# Patient Record
Sex: Female | Born: 1975 | Race: White | Hispanic: No | Smoking: Current every day smoker
Health system: Southern US, Community
[De-identification: ages and names within clinical notes are randomized; demographics above are authoritative.]

## PROBLEM LIST (undated history)

## (undated) DIAGNOSIS — M1990 Unspecified osteoarthritis, unspecified site: Secondary | ICD-10-CM

## (undated) DIAGNOSIS — Z8489 Family history of other specified conditions: Secondary | ICD-10-CM

## (undated) DIAGNOSIS — I10 Essential (primary) hypertension: Secondary | ICD-10-CM

## (undated) DIAGNOSIS — K219 Gastro-esophageal reflux disease without esophagitis: Secondary | ICD-10-CM

## (undated) DIAGNOSIS — F419 Anxiety disorder, unspecified: Secondary | ICD-10-CM

## (undated) DIAGNOSIS — E119 Type 2 diabetes mellitus without complications: Secondary | ICD-10-CM

## (undated) HISTORY — PX: OTHER SURGICAL HISTORY: SHX169

## (undated) HISTORY — PX: DILATION AND CURETTAGE OF UTERUS: SHX78

---

## 2016-06-26 NOTE — Patient Instructions (Addendum)
Katherine Shepherd  06/26/2016   Your procedure is scheduled on: 07/08/2016    [facility]  Entrance [place]  elevators to 3rd floor to  Short Stay Center at    0830 AM.  Call this number if you have problems the morning of surgery [phone]   Remember: ONLY 1 PERSON MAY GO WITH YOU TO SHORT STAY TO GET  READY MORNING OF YOUR SURGERY.  Do not eat food or drink liquids :After Midnight.     Take these medicines the morning of surgery with A SIP OF WATER: oxycodone if needed, Nexium  DO NOT TAKE ANY DIABETIC MEDICATIONS DAY OF YOUR SURGERY                               You may not have any metal on your body including hair pins and              piercings  Do not wear jewelry, make-up, lotions, powders or perfumes, deodorant             Do not wear nail polish.  Do not shave  48 hours prior to surgery.               Do not bring valuables to the hospital. [place] IS NOT             RESPONSIBLE   FOR VALUABLES.  Contacts, dentures or bridgework may not be worn into surgery.  Leave suitcase in the car. After surgery it may be brought to your room.        Special Instructions: coughing and deep breathing exercises, leg exercises               Please read over the following fact sheets you were given: _____________________________________________________________________             [facility] Pre[facility] Before surgery, you can play an important role.  Because skin is not sterile, your skin needs to be as free of germs as possible.  You can reduce the number of germs on your skin by washing with CHG (chlorahexidine gluconate) soap before surgery.  CHG is an antiseptic cleaner which kills germs and bonds with the skin to continue killing germs even after washing. Please DO NOT use if you have an allergy to CHG or antibacterial soaps.  If your skin becomes reddened/irritated stop using the CHG and inform your nurse when you arrive  at Short Stay. Do not shave (including legs and underarms) for at least 48 hours prior to the first CHG shower.  You may shave your face/neck. Please follow these instructions carefully:  1.  Shower with CHG Soap the night before surgery and the  morning of Surgery.  2.  If you choose to wash your hair, wash your hair first as usual with your  normal  shampoo.  3.  After you shampoo, rinse your hair and body thoroughly to remove the  shampoo.                           4.  Use CHG as you would any other liquid soap.  You can apply chg directly  to the skin and wash  Gently with a scrungie or clean washcloth.  5.  Apply the CHG Soap to your body ONLY FROM THE NECK DOWN.   Do not use on face/ open                           Wound or open sores. Avoid contact with eyes, ears mouth and genitals (private parts).                       Wash face,  Genitals (private parts) with your normal soap.             6.  Wash thoroughly, paying special attention to the area where your surgery  will be performed.  7.  Thoroughly rinse your body with warm water from the neck down.  8.  DO NOT shower/wash with your normal soap after using and rinsing off  the CHG Soap.                9.  Pat yourself dry with a clean towel.            10.  Wear clean pajamas.            11.  Place clean sheets on your bed the night of your first shower and do not  sleep with pets. Day of Surgery : Do not apply any lotions/deodorants the morning of surgery.  Please wear clean clothes to the hospital/surgery center.  FAILURE TO FOLLOW THESE INSTRUCTIONS MAY RESULT IN THE CANCELLATION OF YOUR SURGERY PATIENT SIGNATURE_________________________________  NURSE SIGNATURE__________________________________  ________________________________________________________________________  WHAT IS A BLOOD TRANSFUSION? Blood Transfusion Information  A transfusion is the replacement of blood or some of its parts. Blood is made  up of multiple cells which provide different functions.  Red blood cells carry oxygen and are used for blood loss replacement.  White blood cells fight against infection.  Platelets control bleeding.  Plasma helps clot blood.  Other blood products are available for specialized needs, such as hemophilia or other clotting disorders. BEFORE THE TRANSFUSION  Who gives blood for transfusions?   Healthy volunteers who are fully evaluated to make sure their blood is safe. This is blood bank blood. Transfusion therapy is the safest it has ever been in the practice of medicine. Before blood is taken from a donor, a complete history is taken to make sure that person has no history of diseases nor engages in risky social behavior (examples are intravenous drug use or sexual activity with multiple partners). The donor's travel history is screened to minimize risk of transmitting infections, such as malaria. The donated blood is tested for signs of infectious diseases, such as HIV and hepatitis. The blood is then tested to be sure it is compatible with you in order to minimize the chance of a transfusion reaction. If you or a relative donates blood, this is often done in anticipation of surgery and is not appropriate for emergency situations. It takes many days to process the donated blood. RISKS AND COMPLICATIONS Although transfusion therapy is very safe and saves many lives, the main dangers of transfusion include:  1. Getting an infectious disease. 2. Developing a transfusion reaction. This is an allergic reaction to something in the blood you were given. Every precaution is taken to prevent this. The decision to have a blood transfusion has been considered carefully by your caregiver before blood is given. Blood is not given unless the benefits outweigh  the risks. AFTER THE TRANSFUSION  Right after receiving a blood transfusion, you will usually feel much better and more energetic. This is especially  true if your red blood cells have gotten low (anemic). The transfusion raises the level of the red blood cells which carry oxygen, and this usually causes an energy increase.  The nurse administering the transfusion will monitor you carefully for complications. HOME CARE INSTRUCTIONS  No special instructions are needed after a transfusion. You may find your energy is better. Speak with your caregiver about any limitations on activity for underlying diseases you may have. SEEK MEDICAL CARE IF:   Your condition is not improving after your transfusion.  You develop redness or irritation at the intravenous (IV) site. SEEK IMMEDIATE MEDICAL CARE IF:  Any of the following symptoms occur over the next 12 hours:  Shaking chills.  You have a temperature by mouth above 102 F (38.9 C), not controlled by medicine.  Chest, back, or muscle pain.  People around you feel you are not acting correctly or are confused.  Shortness of breath or difficulty breathing.  Dizziness and fainting.  You get a rash or develop hives.  You have a decrease in urine output.  Your urine turns a dark color or changes to pink, red, or brown. Any of the following symptoms occur over the next 10 days:  You have a temperature by mouth above 102 F (38.9 C), not controlled by medicine.  Shortness of breath.  Weakness after normal activity.  The white part of the eye turns yellow (jaundice).  You have a decrease in the amount of urine or are urinating less often.  Your urine turns a dark color or changes to pink, red, or brown. Document Released: 12/13/2000 Document Revised: 03/09/2012 Document Reviewed: 08/01/2008 ExitCare Patient Information 2014 [place].  _______________________________________________________________________  Incentive Spirometer  An incentive spirometer is a tool that can help keep your lungs clear and active. This tool measures how well you are filling your lungs with each  breath. Taking long deep breaths may help reverse or decrease the chance of developing breathing (pulmonary) problems (especially infection) following:  A long period of time when you are unable to move or be active. BEFORE THE PROCEDURE   If the spirometer includes an indicator to show your best effort, your nurse or respiratory therapist will set it to a desired goal.  If possible, sit up straight or lean slightly forward. Try not to slouch.  Hold the incentive spirometer in an upright position. INSTRUCTIONS FOR USE  3. Sit on the edge of your bed if possible, or sit up as far as you can in bed or on a chair. 4. Hold the incentive spirometer in an upright position. 5. Breathe out normally. 6. Place the mouthpiece in your mouth and seal your lips tightly around it. 7. Breathe in slowly and as deeply as possible, raising the piston or the ball toward the top of the column. 8. Hold your breath for 3-5 seconds or for as long as possible. Allow the piston or ball to fall to the bottom of the column. 9. Remove the mouthpiece from your mouth and breathe out normally. 10. Rest for a few seconds and repeat Steps 1 through 7 at least 10 times every 1-2 hours when you are awake. Take your time and take a few normal breaths between deep breaths. 11. The spirometer may include an indicator to show your best effort. Use the indicator as a goal to work  toward during each repetition. 12. After each set of 10 deep breaths, practice coughing to be sure your lungs are clear. If you have an incision (the cut made at the time of surgery), support your incision when coughing by placing a pillow or rolled up towels firmly against it. Once you are able to get out of bed, walk around indoors and cough well. You may stop using the incentive spirometer when instructed by your caregiver.  RISKS AND COMPLICATIONS  Take your time so you do not get dizzy or light-headed.  If you are in pain, you may need to take or ask  for pain medication before doing incentive spirometry. It is harder to take a deep breath if you are having pain. AFTER USE  Rest and breathe slowly and easily.  It can be helpful to keep track of a log of your progress. Your caregiver can provide you with a simple table to help with this. If you are using the spirometer at home, follow these instructions: [place] IF:   You are having difficultly using the spirometer.  You have trouble using the spirometer as often as instructed.  Your pain medication is not giving enough relief while using the spirometer.  You develop fever of 100.5 F (38.1 C) or higher. SEEK IMMEDIATE MEDICAL CARE IF:   You cough up bloody sputum that had not been present before.  You develop fever of 102 F (38.9 C) or greater.  You develop worsening pain at or near the incision site. MAKE SURE YOU:   Understand these instructions.  Will watch your condition.  Will get help right away if you are not doing well or get worse. Document Released: 04/28/2007 Document Revised: 03/09/2012 Document Reviewed: 06/29/2007 [facility] Patient Information 2014 [place], [place].   ________________________________________________________________________

## 2016-06-27 ENCOUNTER — Encounter (HOSPITAL_COMMUNITY)
Admission: RE | Admit: 2016-06-27 | Discharge: 2016-06-27 | Disposition: A | Discharge status: Home / Self Care | Payer: BLUE CROSS/BLUE SHIELD | Source: Ambulatory Visit | Attending: Orthopedic Surgery | Admitting: Orthopedic Surgery

## 2016-06-27 ENCOUNTER — Encounter (HOSPITAL_COMMUNITY): Payer: Self-pay

## 2016-06-27 DIAGNOSIS — Z01812 Encounter for preprocedural laboratory examination: Secondary | ICD-10-CM | POA: Insufficient documentation

## 2016-06-27 DIAGNOSIS — Z0183 Encounter for blood typing: Secondary | ICD-10-CM | POA: Diagnosis not present

## 2016-06-27 DIAGNOSIS — M160 Bilateral primary osteoarthritis of hip: Secondary | ICD-10-CM | POA: Diagnosis not present

## 2016-06-27 HISTORY — DX: Type 2 diabetes mellitus without complications: E11.9

## 2016-06-27 HISTORY — DX: Family history of other specified conditions: Z84.89

## 2016-06-27 HISTORY — DX: Gastro-esophageal reflux disease without esophagitis: K21.9

## 2016-06-27 HISTORY — DX: Unspecified osteoarthritis, unspecified site: M19.90

## 2016-06-27 HISTORY — DX: Anxiety disorder, unspecified: F41.9

## 2016-06-27 HISTORY — DX: Essential (primary) hypertension: I10

## 2016-06-27 LAB — TYPE AND SCREEN
ABO/RH(D): A POS
ANTIBODY SCREEN: NEGATIVE

## 2016-06-27 LAB — BASIC METABOLIC PANEL
ANION GAP: 8 (ref 5–15)
BUN: 10 mg/dL (ref 6–20)
CALCIUM: 8.7 mg/dL — AB (ref 8.9–10.3)
CHLORIDE: 100 mmol/L — AB (ref 101–111)
CO2: 27 mmol/L (ref 22–32)
CREATININE: 0.6 mg/dL (ref 0.44–1.00)
GFR calc non Af Amer: >60 mL/min (ref >60)
Glucose, Bld: 89 mg/dL (ref 65–99)
POTASSIUM: 3.3 mmol/L — AB (ref 3.5–5.1)
Sodium: 135 mmol/L (ref 135–145)

## 2016-06-27 LAB — HCG, SERUM, QUALITATIVE: Preg, Serum: NEGATIVE

## 2016-06-27 LAB — CBC
HEMATOCRIT: 37.9 % (ref 36.0–46.0)
Hemoglobin: 12.7 g/dL (ref 12.0–15.0)
MCH: 28.2 pg (ref 26.0–34.0)
MCHC: 33.5 g/dL (ref 30.0–36.0)
MCV: 84 fL (ref 78.0–100.0)
Platelets: 352 10*3/uL (ref 150–400)
RBC: 4.51 MIL/uL (ref 3.87–5.11)
RDW: 14.8 % (ref 11.5–15.5)
WBC: 9 10*3/uL (ref 4.0–10.5)

## 2016-06-27 LAB — ABO/RH: ABO/RH(D): A POS

## 2016-06-27 LAB — SURGICAL PCR SCREEN
MRSA, PCR: NEGATIVE
STAPHYLOCOCCUS AUREUS: POSITIVE — AB

## 2016-06-27 NOTE — H&P (Signed)
TOTAL HIP ADMISSION H&P  Patient is admitted for bilateral total hip arthroplasty, anterior approach.  Subjective:  Chief Complaint:    Bilateral hip primary OA / pain  HPI: Katherine Shepherd, 40 y.o. female, has a history of pain and functional disability in the bilateral hip(s) due to arthritis and patient has failed non-surgical conservative treatments for greater than 12 weeks to include NSAID's and/or analgesics, corticosteriod injections and activity modification.  Onset of symptoms was gradual starting >10 years ago with rapidlly worsening course since that time.The patient noted no past surgery on the bilateral hip(s).  Patient currently rates pain in the bilateral hips at 10 out of 10 with activity. Patient has night pain, worsening of pain with activity and weight bearing, trendelenberg gait, pain that interfers with activities of daily living and pain with passive range of motion. Patient has evidence of periarticular osteophytes and joint space narrowing by imaging studies. This condition presents safety issues increasing the risk of falls.  There is no current active infection.   Risks, benefits and expectations were discussed with the patient.  Risks including but not limited to the risk of anesthesia, blood clots, nerve damage, blood vessel damage, failure of the prosthesis, infection and up to and including death.  Patient understand the risks, benefits and expectations and wishes to proceed with surgery.   PCP: Pcp Not In System  D/C Plans:      Home with HHPT  Post-op Meds:       No Rx given  Tranexamic Acid:      To be given - IV   Decadron:      Is to be given  FYI:     Xarelto (unable to tolerate ASA do to GERD)  Oxycodone 10-20 mg (already on Percocet 7.5/325 q 6 hrs prn)  Nicotine patch  14 mg   Past Medical History  Diagnosis Date  . Family history of adverse reaction to anesthesia     nauea, mother- had episode of not being able to breathe - mother small airway    . Hypertension   . Diabetes mellitus without complication (HCC)     hx of gestational diabetes   . Anxiety   . GERD (gastroesophageal reflux disease)   . Arthritis     Past Surgical History  Procedure Laterality Date  . Cesarean section    . Dilation and curettage of uterus    . Cyst removal lower back       No prescriptions prior to admission   Allergies  Allergen Reactions  . Sulfa Antibiotics Hives  . Tramadol Nausea And Vomiting    Social History  Substance Use Topics  . Smoking status: Current Every Day Smoker -- 0.50 packs/day for 9 years    Types: Cigarettes  . Smokeless tobacco: Never Used  . Alcohol Use: No       Review of Systems  Constitutional: Negative.   HENT: Negative.   Eyes: Negative.   Respiratory: Negative.   Cardiovascular: Negative.   Gastrointestinal: Positive for heartburn.  Genitourinary: Negative.   Musculoskeletal: Positive for joint pain.  Skin: Negative.   Neurological: Negative.   Endo/Heme/Allergies: Negative.   Psychiatric/Behavioral: The patient is nervous/anxious.     Objective:  Physical Exam  Constitutional: She is oriented to person, place, and time. She appears well-developed.  HENT:  Head: Normocephalic.  Eyes: Pupils are equal, round, and reactive to light.  Neck: Neck supple. No JVD present. No tracheal deviation present. No thyromegaly present.  Cardiovascular:  Normal rate, regular rhythm, normal heart sounds and intact distal pulses.   Respiratory: Effort normal and breath sounds normal. No stridor. No respiratory distress. She has no wheezes.  GI: Soft. There is no tenderness. There is no guarding.  Musculoskeletal:       Right hip: She exhibits decreased range of motion, decreased strength, tenderness and bony tenderness. She exhibits no swelling, no deformity and no laceration.       Left hip: She exhibits decreased range of motion, decreased strength and tenderness. She exhibits no swelling, no deformity and no  laceration.  Lymphadenopathy:    She has no cervical adenopathy.  Neurological: She is alert and oriented to person, place, and time. A sensory deficit (tingling in right foot ) is present.  Skin: Skin is warm and dry.  Psychiatric: She has a normal mood and affect.    Vital signs in last 24 hours: Temp:  [98.7 F (37.1 C)] 98.7 F (37.1 C) (06/29 1552) Pulse Rate:  [93] 93 (06/29 1552) Resp:  [16] 16 (06/29 1552) BP: (130)/(100) 130/100 mmHg (06/29 1552) SpO2:  [100 %] 100 % (06/29 1552) Weight:  [56.246 kg (124 lb)] 56.246 kg (124 lb) (06/29 1552)    Imaging Review Plain radiographs demonstrate severe degenerative joint disease of the bilateral hip(s). The bone quality appears to be good for age and reported activity level.  Assessment/Plan:  End stage arthritis, bilateral hips  The patient history, physical examination, clinical judgement of the provider and imaging studies are consistent with end stage degenerative joint disease of the bilateral hips and total hip arthroplasties are deemed medically necessary. The treatment options including medical management, injection therapy, arthroscopy and arthroplasty were discussed at length. The risks and benefits of total hip arthroplasty were presented and reviewed. The risks due to aseptic loosening, infection, stiffness, dislocation/subluxation,  thromboembolic complications and other imponderables were discussed.  The patient acknowledged the explanation, agreed to proceed with the plan and consent was signed. Patient is being admitted for inpatient treatment for surgery, pain control, PT, OT, prophylactic antibiotics, VTE prophylaxis, progressive ambulation and ADL's and discharge planning.The patient is planning to be discharged home with home health services.     Anastasio AuerbachMatthew S. Rexton Greulich   PA-C  06/27/2016, 5:22 PM

## 2016-06-27 NOTE — Progress Notes (Signed)
BMp done 06/27/16 faxed via EPIC to Dr Charlann Boxerlin.

## 2016-06-27 NOTE — Progress Notes (Signed)
EKG done 06/24/2016 on chart.

## 2016-06-28 NOTE — Progress Notes (Signed)
Spoke with pt on the phone. She understands her surgery time has changed and she is to arrive at the hospital at 11:40AM.  She also will follow instructions to pick up Mupirocin and use as directed.

## 2016-07-08 ENCOUNTER — Encounter (HOSPITAL_COMMUNITY)
Admission: RE | Disposition: A | Discharge status: Home Health | Payer: Self-pay | Source: Ambulatory Visit | Attending: Orthopedic Surgery

## 2016-07-08 ENCOUNTER — Inpatient Hospital Stay (HOSPITAL_COMMUNITY): Payer: BLUE CROSS/BLUE SHIELD

## 2016-07-08 ENCOUNTER — Inpatient Hospital Stay (HOSPITAL_COMMUNITY)
Admission: RE | Admit: 2016-07-08 | Discharge: 2016-07-10 | DRG: 462 | Disposition: A | Discharge status: Home Health | Payer: BLUE CROSS/BLUE SHIELD | Source: Ambulatory Visit | Attending: Orthopedic Surgery | Admitting: Orthopedic Surgery

## 2016-07-08 ENCOUNTER — Inpatient Hospital Stay (HOSPITAL_COMMUNITY): Payer: BLUE CROSS/BLUE SHIELD | Admitting: Anesthesiology

## 2016-07-08 ENCOUNTER — Encounter (HOSPITAL_COMMUNITY): Payer: Self-pay | Admitting: *Deleted

## 2016-07-08 DIAGNOSIS — M62838 Other muscle spasm: Secondary | ICD-10-CM | POA: Diagnosis not present

## 2016-07-08 DIAGNOSIS — K219 Gastro-esophageal reflux disease without esophagitis: Secondary | ICD-10-CM | POA: Diagnosis present

## 2016-07-08 DIAGNOSIS — Z96649 Presence of unspecified artificial hip joint: Secondary | ICD-10-CM

## 2016-07-08 DIAGNOSIS — Z96643 Presence of artificial hip joint, bilateral: Secondary | ICD-10-CM

## 2016-07-08 DIAGNOSIS — I10 Essential (primary) hypertension: Secondary | ICD-10-CM | POA: Diagnosis present

## 2016-07-08 DIAGNOSIS — E119 Type 2 diabetes mellitus without complications: Secondary | ICD-10-CM | POA: Diagnosis present

## 2016-07-08 DIAGNOSIS — E663 Overweight: Secondary | ICD-10-CM | POA: Diagnosis present

## 2016-07-08 DIAGNOSIS — Z6825 Body mass index (BMI) 25.0-25.9, adult: Secondary | ICD-10-CM | POA: Diagnosis not present

## 2016-07-08 DIAGNOSIS — M160 Bilateral primary osteoarthritis of hip: Principal | ICD-10-CM | POA: Diagnosis present

## 2016-07-08 DIAGNOSIS — E876 Hypokalemia: Secondary | ICD-10-CM | POA: Diagnosis present

## 2016-07-08 DIAGNOSIS — Z79899 Other long term (current) drug therapy: Secondary | ICD-10-CM

## 2016-07-08 DIAGNOSIS — F17210 Nicotine dependence, cigarettes, uncomplicated: Secondary | ICD-10-CM | POA: Diagnosis present

## 2016-07-08 DIAGNOSIS — M25559 Pain in unspecified hip: Secondary | ICD-10-CM | POA: Diagnosis present

## 2016-07-08 HISTORY — PX: BILATERAL ANTERIOR TOTAL HIP ARTHROPLASTY: SHX5567

## 2016-07-08 SURGERY — ARTHROPLASTY, HIP, BILATERAL, TOTAL, ANTERIOR APPROACH
Anesthesia: Monitor Anesthesia Care | Site: Hip | Laterality: Bilateral

## 2016-07-08 MED ORDER — DEXAMETHASONE SODIUM PHOSPHATE 10 MG/ML IJ SOLN
10.0000 mg | Freq: Once | INTRAMUSCULAR | Status: AC
  Administered 2016-07-08: 10 mg via INTRAVENOUS

## 2016-07-08 MED ORDER — PROPOFOL 500 MG/50ML IV EMUL: INTRAVENOUS | Status: DC | PRN

## 2016-07-08 MED ORDER — HYDROCHLOROTHIAZIDE 25 MG PO TABS
12.5000 mg | ORAL_TABLET | Freq: Every day | ORAL | Status: DC
  Administered 2016-07-08 – 2016-07-09 (×2): 12.5 mg via ORAL
  Filled 2016-07-08 (×3): qty 1

## 2016-07-08 MED ORDER — CEFAZOLIN SODIUM-DEXTROSE 2-4 GM/100ML-% IV SOLN
INTRAVENOUS | Status: AC
  Filled 2016-07-08: qty 100

## 2016-07-08 MED ORDER — PROPOFOL 500 MG/50ML IV EMUL
INTRAVENOUS | Status: DC | PRN
  Administered 2016-07-08: 75 ug/kg/min via INTRAVENOUS

## 2016-07-08 MED ORDER — CEFAZOLIN SODIUM-DEXTROSE 2-4 GM/100ML-% IV SOLN
2.0000 g | INTRAVENOUS | Status: AC
  Administered 2016-07-08: 2 g via INTRAVENOUS
  Filled 2016-07-08: qty 100

## 2016-07-08 MED ORDER — PROPOFOL 10 MG/ML IV BOLUS
INTRAVENOUS | Status: AC
  Filled 2016-07-08: qty 20

## 2016-07-08 MED ORDER — HYDROMORPHONE HCL 1 MG/ML IJ SOLN
INTRAMUSCULAR | Status: AC
  Filled 2016-07-08: qty 1

## 2016-07-08 MED ORDER — DEXAMETHASONE SODIUM PHOSPHATE 10 MG/ML IJ SOLN
10.0000 mg | Freq: Once | INTRAMUSCULAR | Status: AC
  Administered 2016-07-09: 10 mg via INTRAVENOUS
  Filled 2016-07-08: qty 1

## 2016-07-08 MED ORDER — PROPOFOL 10 MG/ML IV BOLUS
INTRAVENOUS | Status: DC | PRN
  Administered 2016-07-08: 30 mg via INTRAVENOUS

## 2016-07-08 MED ORDER — ALUM & MAG HYDROXIDE-SIMETH 200-200-20 MG/5ML PO SUSP
30.0000 mL | ORAL | Status: DC | PRN
  Administered 2016-07-09: 30 mL via ORAL
  Filled 2016-07-08: qty 30

## 2016-07-08 MED ORDER — OXYCODONE HCL 5 MG PO TABS
10.0000 mg | ORAL_TABLET | ORAL | Status: DC
Start: 2016-07-08 — End: 2016-07-10
  Administered 2016-07-08 (×2): 10 mg via ORAL
  Administered 2016-07-08 – 2016-07-10 (×10): 20 mg via ORAL
  Filled 2016-07-08: qty 2
  Filled 2016-07-08 (×5): qty 4
  Filled 2016-07-08: qty 2
  Filled 2016-07-08 (×5): qty 4

## 2016-07-08 MED ORDER — MAGNESIUM CITRATE PO SOLN: 1.0000 | Freq: Once | ORAL | Status: DC | PRN

## 2016-07-08 MED ORDER — MIDAZOLAM HCL 5 MG/5ML IJ SOLN
INTRAMUSCULAR | Status: DC | PRN
  Administered 2016-07-08 (×2): 2 mg via INTRAVENOUS

## 2016-07-08 MED ORDER — CITALOPRAM HYDROBROMIDE 20 MG PO TABS
20.0000 mg | ORAL_TABLET | Freq: Every day | ORAL | Status: DC
  Administered 2016-07-08 – 2016-07-09 (×2): 20 mg via ORAL
  Filled 2016-07-08 (×2): qty 1

## 2016-07-08 MED ORDER — LIDOCAINE HCL (CARDIAC) 20 MG/ML IV SOLN
INTRAVENOUS | Status: DC | PRN
  Administered 2016-07-08: 50 mg via INTRAVENOUS

## 2016-07-08 MED ORDER — CEFAZOLIN SODIUM-DEXTROSE 2-4 GM/100ML-% IV SOLN
2.0000 g | Freq: Four times a day (QID) | INTRAVENOUS | Status: AC
  Administered 2016-07-08 – 2016-07-09 (×2): 2 g via INTRAVENOUS
  Filled 2016-07-08 (×2): qty 100

## 2016-07-08 MED ORDER — MIDAZOLAM HCL 2 MG/2ML IJ SOLN
INTRAMUSCULAR | Status: AC
  Filled 2016-07-08: qty 2

## 2016-07-08 MED ORDER — FENTANYL CITRATE (PF) 100 MCG/2ML IJ SOLN
INTRAMUSCULAR | Status: DC | PRN
  Administered 2016-07-08: 100 ug via INTRAVENOUS
  Administered 2016-07-08: 50 ug via INTRAVENOUS

## 2016-07-08 MED ORDER — NON FORMULARY: 40.0000 mg | Freq: Two times a day (BID) | Status: DC

## 2016-07-08 MED ORDER — HYDROMORPHONE HCL 1 MG/ML IJ SOLN
0.2500 mg | INTRAMUSCULAR | Status: DC | PRN
  Administered 2016-07-08 (×4): 0.5 mg via INTRAVENOUS

## 2016-07-08 MED ORDER — TRANEXAMIC ACID 1000 MG/10ML IV SOLN
1000.0000 mg | INTRAVENOUS | Status: AC
  Administered 2016-07-08: 1000 mg via INTRAVENOUS
  Filled 2016-07-08: qty 10

## 2016-07-08 MED ORDER — PROMETHAZINE HCL 25 MG/ML IJ SOLN: 6.2500 mg | INTRAMUSCULAR | Status: DC | PRN

## 2016-07-08 MED ORDER — PROPOFOL 10 MG/ML IV BOLUS
INTRAVENOUS | Status: AC
  Filled 2016-07-08: qty 40

## 2016-07-08 MED ORDER — FENTANYL CITRATE (PF) 100 MCG/2ML IJ SOLN
INTRAMUSCULAR | Status: AC
  Filled 2016-07-08: qty 2

## 2016-07-08 MED ORDER — BUPIVACAINE HCL (PF) 0.5 % IJ SOLN
INTRAMUSCULAR | Status: DC | PRN
  Administered 2016-07-08: 2.5 mL

## 2016-07-08 MED ORDER — ONDANSETRON HCL 4 MG PO TABS: 4.0000 mg | ORAL_TABLET | Freq: Four times a day (QID) | ORAL | Status: DC | PRN

## 2016-07-08 MED ORDER — TRANEXAMIC ACID 1000 MG/10ML IV SOLN
1000.0000 mg | Freq: Once | INTRAVENOUS | Status: AC
  Administered 2016-07-08: 1000 mg via INTRAVENOUS
  Filled 2016-07-08: qty 10

## 2016-07-08 MED ORDER — PHENOL 1.4 % MT LIQD: 1.0000 | OROMUCOSAL | Status: DC | PRN

## 2016-07-08 MED ORDER — HYDROMORPHONE HCL 1 MG/ML IJ SOLN
0.5000 mg | INTRAMUSCULAR | Status: DC | PRN
  Administered 2016-07-08 – 2016-07-09 (×2): 1 mg via INTRAVENOUS
  Filled 2016-07-08 (×2): qty 1

## 2016-07-08 MED ORDER — RIVAROXABAN 10 MG PO TABS
10.0000 mg | ORAL_TABLET | ORAL | Status: DC
  Administered 2016-07-09 – 2016-07-10 (×2): 10 mg via ORAL
  Filled 2016-07-08 (×2): qty 1

## 2016-07-08 MED ORDER — BUPIVACAINE HCL (PF) 0.5 % IJ SOLN
INTRAMUSCULAR | Status: AC
  Filled 2016-07-08: qty 30

## 2016-07-08 MED ORDER — PROPOFOL 10 MG/ML IV BOLUS
INTRAVENOUS | Status: AC
Start: 2016-07-08 — End: 2016-07-08
  Filled 2016-07-08: qty 40

## 2016-07-08 MED ORDER — MENTHOL 3 MG MT LOZG: 1.0000 | LOZENGE | OROMUCOSAL | Status: DC | PRN

## 2016-07-08 MED ORDER — ESOMEPRAZOLE MAGNESIUM 40 MG PO CPDR
40.0000 mg | DELAYED_RELEASE_CAPSULE | Freq: Two times a day (BID) | ORAL | Status: DC
  Administered 2016-07-09 – 2016-07-10 (×3): 40 mg via ORAL
  Filled 2016-07-08 (×4): qty 1

## 2016-07-08 MED ORDER — DOCUSATE SODIUM 100 MG PO CAPS
100.0000 mg | ORAL_CAPSULE | Freq: Two times a day (BID) | ORAL | Status: DC
  Administered 2016-07-08 – 2016-07-10 (×4): 100 mg via ORAL
  Filled 2016-07-08 (×4): qty 1

## 2016-07-08 MED ORDER — ONDANSETRON HCL 4 MG/2ML IJ SOLN: 4.0000 mg | Freq: Four times a day (QID) | INTRAMUSCULAR | Status: DC | PRN

## 2016-07-08 MED ORDER — FERROUS SULFATE 325 (65 FE) MG PO TABS
325.0000 mg | ORAL_TABLET | Freq: Three times a day (TID) | ORAL | Status: DC
  Administered 2016-07-09 – 2016-07-10 (×4): 325 mg via ORAL
  Filled 2016-07-08 (×4): qty 1

## 2016-07-08 MED ORDER — POLYETHYLENE GLYCOL 3350 17 G PO PACK
17.0000 g | PACK | Freq: Two times a day (BID) | ORAL | Status: DC
  Administered 2016-07-08 – 2016-07-10 (×4): 17 g via ORAL
  Filled 2016-07-08 (×4): qty 1

## 2016-07-08 MED ORDER — NICOTINE 14 MG/24HR TD PT24
14.0000 mg | MEDICATED_PATCH | Freq: Every day | TRANSDERMAL | Status: DC
  Administered 2016-07-08 – 2016-07-09 (×2): 14 mg via TRANSDERMAL
  Filled 2016-07-08 (×2): qty 1

## 2016-07-08 MED ORDER — METOCLOPRAMIDE HCL 5 MG PO TABS: 5.0000 mg | ORAL_TABLET | Freq: Three times a day (TID) | ORAL | Status: DC | PRN

## 2016-07-08 MED ORDER — METOCLOPRAMIDE HCL 5 MG/ML IJ SOLN: 5.0000 mg | Freq: Three times a day (TID) | INTRAMUSCULAR | Status: DC | PRN

## 2016-07-08 MED ORDER — BISACODYL 10 MG RE SUPP: 10.0000 mg | Freq: Every day | RECTAL | Status: DC | PRN

## 2016-07-08 MED ORDER — LACTATED RINGERS IV SOLN
INTRAVENOUS | Status: DC
  Administered 2016-07-08: 15:00:00 via INTRAVENOUS
  Administered 2016-07-08: 1000 mL via INTRAVENOUS
  Administered 2016-07-08 (×2): via INTRAVENOUS

## 2016-07-08 MED ORDER — CHLORHEXIDINE GLUCONATE 4 % EX LIQD: 60.0000 mL | Freq: Once | CUTANEOUS | Status: DC

## 2016-07-08 MED ORDER — CYCLOBENZAPRINE HCL 10 MG PO TABS
10.0000 mg | ORAL_TABLET | Freq: Three times a day (TID) | ORAL | Status: DC | PRN
  Administered 2016-07-08: 10 mg via ORAL
  Filled 2016-07-08: qty 1

## 2016-07-08 MED ORDER — SODIUM CHLORIDE 0.9 % IV SOLN
100.0000 mL/h | INTRAVENOUS | Status: DC
  Administered 2016-07-09 – 2016-07-10 (×3): 100 mL/h via INTRAVENOUS
  Filled 2016-07-08 (×8): qty 1000

## 2016-07-08 MED ORDER — DIPHENHYDRAMINE HCL 25 MG PO CAPS: 25.0000 mg | ORAL_CAPSULE | Freq: Four times a day (QID) | ORAL | Status: DC | PRN

## 2016-07-08 SURGICAL SUPPLY — 43 items
BAG DECANTER FOR FLEXI CONT (MISCELLANEOUS) ×3 IMPLANT
BAG ZIPLOCK 12X15 (MISCELLANEOUS) ×6 IMPLANT
BLADE SURG SZ10 CARB STEEL (BLADE) ×6 IMPLANT
CAPT HIP TOTAL 3 ×6 IMPLANT
CLOTH BEACON ORANGE TIMEOUT ST (SAFETY) ×3 IMPLANT
COVER PERINEAL POST (MISCELLANEOUS) ×3 IMPLANT
DRAPE C-ARM 42X120 X-RAY (DRAPES) ×3 IMPLANT
DRAPE INCISE IOBAN 66X45 STRL (DRAPES) ×3 IMPLANT
DRAPE STERI IOBAN 125X83 (DRAPES) ×6 IMPLANT
DRAPE U-SHAPE 47X51 STRL (DRAPES) ×21 IMPLANT
DRAPE UNIVERSAL PACK (DRAPES) IMPLANT
DRESSING AQUACEL AG SP 3.5X10 (GAUZE/BANDAGES/DRESSINGS) ×2 IMPLANT
DRSG AQUACEL AG SP 3.5X10 (GAUZE/BANDAGES/DRESSINGS) ×6
DURAPREP 26ML APPLICATOR (WOUND CARE) ×6 IMPLANT
ELECT BLADE TIP CTD 4 INCH (ELECTRODE) ×6 IMPLANT
ELECT PENCIL ROCKER SW 15FT (MISCELLANEOUS) ×3 IMPLANT
ELECT REM PT RETURN 9FT ADLT (ELECTROSURGICAL) ×6
ELECTRODE REM PT RTRN 9FT ADLT (ELECTROSURGICAL) ×2 IMPLANT
FACESHIELD WRAPAROUND (MASK) ×12 IMPLANT
GLOVE BIOGEL M 7.0 STRL (GLOVE) IMPLANT
GLOVE BIOGEL PI IND STRL 7.5 (GLOVE) ×2 IMPLANT
GLOVE BIOGEL PI IND STRL 8.5 (GLOVE) ×2 IMPLANT
GLOVE BIOGEL PI INDICATOR 7.5 (GLOVE) ×4
GLOVE BIOGEL PI INDICATOR 8.5 (GLOVE) ×4
GLOVE ECLIPSE 8.0 STRL XLNG CF (GLOVE) ×6 IMPLANT
GLOVE ORTHO TXT STRL SZ7.5 (GLOVE) ×12 IMPLANT
GOWN STRL REUS W/TWL LRG LVL3 (GOWN DISPOSABLE) ×3 IMPLANT
GOWN STRL REUS W/TWL XL LVL3 (GOWN DISPOSABLE) ×6 IMPLANT
LIQUID BAND (GAUZE/BANDAGES/DRESSINGS) ×6 IMPLANT
MARKER SKIN DUAL TIP RULER LAB (MISCELLANEOUS) ×3 IMPLANT
PACK ANTERIOR HIP CUSTOM (KITS) ×3 IMPLANT
SAW OSC TIP CART 19.5X105X1.3 (SAW) ×6 IMPLANT
SPONGE LAP 18X18 X RAY DECT (DISPOSABLE) IMPLANT
SUT MNCRL AB 4-0 PS2 18 (SUTURE) ×6 IMPLANT
SUT VIC AB 1 CT1 36 (SUTURE) ×18 IMPLANT
SUT VIC AB 2-0 CT1 27 (SUTURE) ×8
SUT VIC AB 2-0 CT1 TAPERPNT 27 (SUTURE) ×4 IMPLANT
SUT VLOC 180 0 24IN GS25 (SUTURE) ×6 IMPLANT
SYR 50ML LL SCALE MARK (SYRINGE) ×3 IMPLANT
TOWEL OR 17X26 10 PK STRL BLUE (TOWEL DISPOSABLE) IMPLANT
TRAY FOLEY W/METER SILVER 14FR (SET/KITS/TRAYS/PACK) ×3 IMPLANT
TRAY FOLEY W/METER SILVER 16FR (SET/KITS/TRAYS/PACK) IMPLANT
YANKAUER SUCT BULB TIP 10FT TU (MISCELLANEOUS) ×3 IMPLANT

## 2016-07-08 NOTE — Anesthesia Preprocedure Evaluation (Addendum)
Anesthesia Evaluation  Patient identified by MRN, date of birth, ID band Patient awake    Reviewed: Allergy & Precautions, NPO status , Patient's Chart, lab work & pertinent test results  History of Anesthesia Complications (+) Family history of anesthesia reaction and history of anesthetic complications  Airway Mallampati: II  TM Distance: >3 FB Neck ROM: Full    Dental  (+) Teeth Intact, Dental Advisory Given   Pulmonary Current Smoker,    Pulmonary exam normal breath sounds clear to auscultation       Cardiovascular hypertension, Pt. on medications Normal cardiovascular exam Rhythm:Regular Rate:Normal     Neuro/Psych PSYCHIATRIC DISORDERS Anxiety negative neurological ROS     GI/Hepatic Neg liver ROS, GERD  Medicated and Controlled,  Endo/Other  diabetes  Renal/GU negative Renal ROS     Musculoskeletal  (+) Arthritis ,   Abdominal   Peds  Hematology negative hematology ROS (+) Plt 352k   Anesthesia Other Findings Day of surgery medications reviewed with the patient.  Reproductive/Obstetrics                          Anesthesia Physical Anesthesia Plan  ASA: II  Anesthesia Plan: Spinal and MAC   Post-op Pain Management:    Induction: Intravenous  Airway Management Planned: Simple Face Mask  Additional Equipment:   Intra-op Plan:   Post-operative Plan:   Informed Consent: I have reviewed the patients History and Physical, chart, labs and discussed the procedure including the risks, benefits and alternatives for the proposed anesthesia with the patient or authorized representative who has indicated his/her understanding and acceptance.   Dental advisory given  Plan Discussed with: CRNA, Anesthesiologist and Surgeon  Anesthesia Plan Comments: (Discussed risks and benefits of and differences between spinal and general. Discussed risks of spinal including headache, backache,  failure, bleeding, infection, and nerve damage. Patient consents to spinal. Questions answered. Coagulation studies and platelet count acceptable.)        Anesthesia Quick Evaluation

## 2016-07-08 NOTE — Interval H&P Note (Signed)
History and Physical Interval Note:  07/08/2016 1:32 PM  Katherine Shepherd  has presented today for surgery, with the diagnosis of BILATERAL HIP OA  The various methods of treatment have been discussed with the patient and family. After consideration of risks, benefits and other options for treatment, the patient has consented to  Procedure(s): BILATERAL ANTERIOR APPROACH TOTAL HIP ARTHROPLASTY (Bilateral) as a surgical intervention .  The patient's history has been reviewed, patient examined, no change in status, stable for surgery.  I have reviewed the patient's chart and labs.  Questions were answered to the patient's satisfaction.     Shelda PalLIN,Kiyara Bouffard D

## 2016-07-08 NOTE — Op Note (Signed)
NAME:  Katherine Shepherd                ACCOUNT NO.: [id number]      MEDICAL RECORD NO.: [id number]      FACILITY[facility]      PHYSICIAN:  Durene RomansLIN,Mayan Dolney D  DATE OF BIRTH:  1976-12-03     DATE OF PROCEDURE:  07/08/2016                                 OPERATIVE REPORT         PREOPERATIVE DIAGNOSIS: Bilateral  hip osteoarthritis.      POSTOPERATIVE DIAGNOSIS:  Bilateral hip osteoarthritis.      PROCEDURE:  Right total hip replacement through an anterior approach   utilizing DePuy THR system, component size 48mm pinnacle cup, a size 28 neutral   Ceramax ceramic liner, a size 2 Hi Tri Lock stem with a 28+1.5 delta ceramic   ball.      SURGEON:  Madlyn FrankelMatthew D. Charlann Boxerlin, M.D.      ASSISTANT:  Lanney GinsMatthew Babish, PA-C     ANESTHESIA:  Spinal.      SPECIMENS:  None.      COMPLICATIONS:  None.      BLOOD LOSS:  450 cc     DRAINS:  None.      INDICATION OF THE PROCEDURE:  Katherine Shepherd is a 40 y.o. female who had   presented to office for evaluation of bilateral hip pain.  Radiographs revealed   progressive advanced degenerative changes with bone-on-bone   articulation to the  hip joint.  The patient had painful limited range of   motion with both hips that was significantly affecting her overall quality of life.  The patient was failing to    respond to conservative measures, and at this point was ready   to proceed with more definitive measures.  The patient has noted progressive   degenerative changes in his hip, progressive problems and dysfunction   with regarding the hip prior to surgery.  Consent was obtained for   benefit of pain relief.  Specific risk of infection, DVT, component   failure, dislocation, need for revision surgery, as well discussion of   the anterior versus posterior approach were reviewed.  Consent was   obtained for benefit of anterior pain relief through an anterior   approach.  Specific discussion was had regarding simultaneously  performed bilateral total hips versus staged.  She wished to proceed as single procedure.     PROCEDURE IN DETAIL:  The patient was brought to operative theater.   Once adequate anesthesia, preoperative antibiotics, 2 gm of Ancef, 1 gm of Tranexamic Acid, and 10 mg of Decadron administered.   The patient was positioned supine on the OSI Hanna table.  Once adequate   padding of boney process was carried out, we had predraped out both hips, and  used fluoroscopy to confirm orientation of the pelvis and position.      Both hipd were then prepped and draped from proximal iliac crest to   mid thigh with shower curtain technique.      Time-out was performed identifying the patient, planned procedures, and each extremity.     We first began with the right hip.  The left hip is in separate note below.   An incision was then made 2 cm distal and lateral to the   anterior  superior iliac spine extending over the orientation of the   tensor fascia lata muscle and sharp dissection was carried down to the   fascia of the muscle and protractor placed in the soft tissues.      The fascia was then incised.  The muscle belly was identified and swept   laterally and retractor placed along the superior neck.  Following   cauterization of the circumflex vessels and removing some pericapsular   fat, a second cobra retractor was placed on the inferior neck.  A third   retractor was placed on the anterior acetabulum after elevating the   anterior rectus.  A L-capsulotomy was along the line of the   superior neck to the trochanteric fossa, then extended proximally and   distally.  Tag sutures were placed and the retractors were then placed   intracapsular.  We then identified the trochanteric fossa and   orientation of my neck cut, confirmed this radiographically   and then made a neck osteotomy with the femur on traction.  The femoral   head was removed without difficulty or complication.  Traction was let    off and retractors were placed posterior and anterior around the   acetabulum.      The labrum and foveal tissue were debrided.  I began reaming with a 45mm   reamer and reamed up to 47mm reamer with good bony bed preparation and a 48mm   cup was chosen.  The final 48mm Pinnacle cup was then impacted under fluoroscopy  to confirm the depth of penetration and orientation with respect to   abduction.  A screw was placed followed by the hole eliminator.  The final   28 mm Ceramax ceramic liner was impacted with good visualized rim fit without complication.  The cup was positioned anatomically within the acetabular portion of the pelvis.      At this point, the femur was rolled at 80 degrees.  Further capsule was   released off the inferior aspect of the femoral neck.  I then   released the superior capsule proximally.  The hook was placed laterally   along the femur and elevated manually and held in position with the bed   hook.  The leg was then extended and adducted with the leg rolled to 100   degrees of external rotation.  Once the proximal femur was fully   exposed, I used a box osteotome to set orientation.  I then began   broaching with the starting chili pepper broach and passed this by hand and then broached up to the size 2.  With the 2 boach in place I chose a high offset neck and did several trial reductions.  Given these findings, I went ahead and dislocated the hip, repositioned all   retractors and positioned the right hip in the extended and abducted position.  The final 2 Hi Tri Lock stem was   chosen and it was impacted down to the level of neck cut.  Based on this   and the trial reduction, the 28+1.5 delta ceramic ball was chosen and   impacted onto a clean and dry trunnion, and the hip was reduced.  The   hip had been irrigated throughout the case again at this point.  I did   reapproximate the superior capsular leaflet to the anterior leaflet   using #1 Vicryl.  The fascia  of the   tensor fascia lata muscle was then reapproximated using #1 Vicryl and #  0 V-lock sutures.  The   remaining wound was closed with 2-0 Vicryl and running 4-0 Monocryl.   The hip was cleaned, dried, and dressed sterilely using Dermabond and   Aquacel dressing.     Lanney Gins, PA-C was present for the entirety of the case involved from   preoperative positioning, perioperative retractor management, general   facilitation of the case, as well as primary wound closure as assistant.            Madlyn Frankel Charlann Boxer, M.D.        07/08/2016 7:48 PM         NAME:  Katherine Shepherd                ACCOUNT NO.: [id number]      MEDICAL RECORD NO.: [id number]      FACILITY:  [facility]      PHYSICIAN:  Durene Romans D  DATE OF BIRTH:  09-10-1976     DATE OF PROCEDURE:  07/08/2016                                 OPERATIVE REPORT         PREOPERATIVE DIAGNOSIS: Bilateral  hip osteoarthritis.      POSTOPERATIVE DIAGNOSIS:  Bilateral hip osteoarthritis.      PROCEDURE:  Left total hip replacement through an anterior approach   utilizing DePuy THR system, component size 48mm pinnacle cup, a size 28 neutral   Ceramax ceramic liner, a size 2 Hi Tri Lock stem with a 28+1.5 delta ceramic   ball.      SURGEON:  Madlyn Frankel. Charlann Boxer, M.D.      ASSISTANT:  Lanney Gins, PA-C      ANESTHESIA:  Spinal.      SPECIMENS:  None.      COMPLICATIONS:  None.      BLOOD LOSS:  450 cc     DRAINS:  None.      INDICATION OF THE PROCEDURE:   As noted above.     PROCEDURE IN DETAIL:    As noted above both hips were prepped and draped in sterile fashion.  A separate timeout was performed identifying this left hip.      The left hip was then prepped and draped from proximal iliac crest to   mid thigh with shower curtain technique.      Time-out was performed identifying the patient, planned procedure, and   extremity.     An incision was then made 2 cm distal and lateral to  the   anterior superior iliac spine extending over the orientation of the   tensor fascia lata muscle and sharp dissection was carried down to the   fascia of the muscle and protractor placed in the soft tissues.      The fascia was then incised.  The muscle belly was identified and swept   laterally and retractor placed along the superior neck.  Following   cauterization of the circumflex vessels and removing some pericapsular   fat, a second cobra retractor was placed on the inferior neck.  A third   retractor was placed on the anterior acetabulum after elevating the   anterior rectus.  A L-capsulotomy was along the line of the   superior neck to the trochanteric fossa, then extended proximally and   distally.  Tag sutures were placed and the retractors were then placed  intracapsular.  We then identified the trochanteric fossa and   orientation of my neck cut, confirmed this radiographically   and then made a neck osteotomy with the femur on traction.  The femoral   head was removed without difficulty or complication.  Traction was let   off and retractors were placed posterior and anterior around the   acetabulum.      The labrum and foveal tissue were debrided.  I began reaming with a 45mm   reamer and reamed up to 47mm reamer with good bony bed preparation and a 48mm    cup was chosen.  The final 48mm Pinnacle cup was then impacted under fluoroscopy  to confirm the depth of penetration and orientation with respect to   abduction.  A screw was placed followed by the hole eliminator.  The final   28 mm Ceramax ceramic liner was impacted with good visualized rim fit without complication.  The cup was positioned anatomically within the acetabular portion of the pelvis.      At this point, the femur was rolled at 80 degrees.  Further capsule was   released off the inferior aspect of the femoral neck.  I then   released the superior capsule proximally.  The hook was placed laterally    along the femur and elevated manually and held in position with the bed   hook.  The leg was then extended and adducted with the leg rolled to 100   degrees of external rotation.  Once the proximal femur was fully   exposed, I used a box osteotome to set orientation.  I then began   broaching with the starting chili pepper broach and passed this by hand and then broached up to 2.  With the 2 broach in place I chose a high offset neck to match the other hip and did several trial reductions.  The offset was appropriate, leg lengths   appeared to be equal matched to the other side with the +1.5 head ball, confirmed radiographically.   Given these findings, I went ahead and dislocated the hip, repositioned all   retractors and positioned the right hip in the extended and abducted position.  The final 2 Hi Tri Lock stem was   chosen and it was impacted down to the level of neck cut.  Based on this   and the trial reduction, the 28+1.5 delta ceramic ball was chosen and   impacted onto a clean and dry trunnion, and the hip was reduced.  The   hip had been irrigated throughout the case again at this point.  I did   reapproximate the superior capsular leaflet to the anterior leaflet   using #1 Vicryl.  The fascia of the   tensor fascia lata muscle was then reapproximated using #1 Vicryl and #0 V-lock sutures.  The   remaining wound was closed with 2-0 Vicryl and running 4-0 Monocryl.   The hip was cleaned, dried, and dressed sterilely using Dermabond and   Aquacel dressing.   She was then brought to recovery room in stable condition tolerating the procedure well.    Lanney Gins, PA-C was present for the entirety of the case involved from   preoperative positioning, perioperative retractor management, general   facilitation of the case, as well as primary wound closure as assistant.            Madlyn Frankel Charlann Boxer, M.D.        07/08/2016 7:48 PM

## 2016-07-08 NOTE — Anesthesia Procedure Notes (Signed)
Spinal  Start time: 07/08/2016 2:42 PM End time: 07/08/2016 2:49 PM Staffing Resident/CRNA: Mirian MoARVER, Rihaan Barrack J Performed by: resident/CRNA  Preanesthetic Checklist Completed: patient identified, site marked, surgical consent, pre-op evaluation, timeout performed, IV checked, risks and benefits discussed and monitors and equipment checked Spinal Block Patient position: sitting Prep: Betadine Patient monitoring: heart rate, continuous pulse ox and blood pressure Approach: midline Location: L4-5 Injection technique: single-shot Needle Needle type: Sprotte  Needle gauge: 24 G Needle length: 9 cm Needle insertion depth: 6 cm Additional Notes Pt sitting position sterile prep and drap, positive aspirate, negative paresthesia, negative heme

## 2016-07-08 NOTE — Anesthesia Postprocedure Evaluation (Signed)
Anesthesia Post Note  Patient: Sherre ScarletKarla B Bayless  Procedure(s) Performed: Procedure(s) (LRB): BILATERAL ANTERIOR APPROACH TOTAL HIP ARTHROPLASTY (Bilateral)  Patient location during evaluation: PACU Anesthesia Type: Spinal Level of consciousness: awake and alert Pain management: pain level controlled Vital Signs Assessment: post-procedure vital signs reviewed and stable Respiratory status: spontaneous breathing, nonlabored ventilation, respiratory function stable and patient connected to nasal cannula oxygen Cardiovascular status: blood pressure returned to baseline and stable Postop Assessment: no signs of nausea or vomiting Anesthetic complications: no    Last Vitals:  Filed Vitals:   07/08/16 1750 07/08/16 1755  BP:    Pulse: 103 93  Temp:    Resp: 20 18    Last Pain:  Filed Vitals:   07/08/16 1755  PainSc: 7     LLE Motor Response: No movement due to regional block (07/08/16 1745)   RLE Motor Response: No movement due to regional block (07/08/16 1745)   L Sensory Level: T12-Inguinal (groin) region (07/08/16 1745) R Sensory Level: T12-Inguinal (groin) region (07/08/16 1745)  Cote Mayabb S

## 2016-07-08 NOTE — Transfer of Care (Signed)
Immediate Anesthesia Transfer of Care Note  Patient: Katherine Shepherd  Procedure(s) Performed: Procedure(s): BILATERAL ANTERIOR APPROACH TOTAL HIP ARTHROPLASTY (Bilateral)  Patient Location: PACU  Anesthesia Type:Spinal  Level of Consciousness: awake, alert  and oriented  Airway & Oxygen Therapy: Patient Spontanous Breathing and Patient connected to face mask oxygen  Post-op Assessment: Report given to RN and Post -op Vital signs reviewed and stable  Post vital signs: Reviewed and stable  Last Vitals:  Filed Vitals:   07/08/16 1058  BP: 148/94  Pulse: 99  Temp: 36.8 C  Resp: 18    Last Pain:  Filed Vitals:   07/08/16 1332  PainSc: 4       Patients Stated Pain Goal: 4 (07/08/16 1331)  Complications: No apparent anesthesia complications

## 2016-07-08 NOTE — Discharge Instructions (Addendum)
INSTRUCTIONS AFTER JOINT REPLACEMENT  ° °o Remove items at home which could result in a fall. This includes throw rugs or furniture in walking pathways °o ICE to the affected joint every three hours while awake for 30 minutes at a time, for at least the first 3-5 days, and then as needed for pain and swelling.  Continue to use ice for pain and swelling. You may notice swelling that will progress down to the foot and ankle.  This is normal after surgery.  Elevate your leg when you are not up walking on it.   °o Continue to use the breathing machine you got in the hospital (incentive spirometer) which will help keep your temperature down.  It is common for your temperature to cycle up and down following surgery, especially at night when you are not up moving around and exerting yourself.  The breathing machine keeps your lungs expanded and your temperature down. ° ° °DIET:  As you were doing prior to hospitalization, we recommend a well-balanced diet. ° °DRESSING / WOUND CARE / SHOWERING ° °Keep the surgical dressing until follow up.  The dressing is water proof, so you can shower without any extra covering.  IF THE DRESSING FALLS OFF or the wound gets wet inside, change the dressing with sterile gauze.  Please use good hand washing techniques before changing the dressing.  Do not use any lotions or creams on the incision until instructed by your surgeon.   ° °ACTIVITY ° °o Increase activity slowly as tolerated, but follow the weight bearing instructions below.   °o No driving for 6 weeks or until further direction given by your physician.  You cannot drive while taking narcotics.  °o No lifting or carrying greater than 10 lbs. until further directed by your surgeon. °o Avoid periods of inactivity such as sitting longer than an hour when not asleep. This helps prevent blood clots.  °o You may return to work once you are authorized by your doctor.  ° ° ° °WEIGHT BEARING  ° °Weight bearing as tolerated with assist  device (walker, cane, etc) as directed, use it as long as suggested by your surgeon or therapist, typically at least 4-6 weeks. ° ° °EXERCISES ° °Results after joint replacement surgery are often greatly improved when you follow the exercise, range of motion and muscle strengthening exercises prescribed by your doctor. Safety measures are also important to protect the joint from further injury. Any time any of these exercises cause you to have increased pain or swelling, decrease what you are doing until you are comfortable again and then slowly increase them. If you have problems or questions, call your caregiver or physical therapist for advice.  ° °Rehabilitation is important following a joint replacement. After just a few days of immobilization, the muscles of the leg can become weakened and shrink (atrophy).  These exercises are designed to build up the tone and strength of the thigh and leg muscles and to improve motion. Often times heat used for twenty to thirty minutes before working out will loosen up your tissues and help with improving the range of motion but do not use heat for the first two weeks following surgery (sometimes heat can increase post-operative swelling).  ° °These exercises can be done on a training (exercise) mat, on the floor, on a table or on a bed. Use whatever works the best and is most comfortable for you.    Use music or television while you are exercising so that   the exercises are a pleasant break in your day. This will make your life better with the exercises acting as a break in your routine that you can look forward to.   Perform all exercises about fifteen times, three times per day or as directed.  You should exercise both the operative leg and the other leg as well. ° °Exercises include: °  °• Quad Sets - Tighten up the muscle on the front of the thigh (Quad) and hold for 5-10 seconds.   °• Straight Leg Raises - With your knee straight (if you were given a brace, keep it on),  lift the leg to 60 degrees, hold for 3 seconds, and slowly lower the leg.  Perform this exercise against resistance later as your leg gets stronger.  °• Leg Slides: Lying on your back, slowly slide your foot toward your buttocks, bending your knee up off the floor (only go as far as is comfortable). Then slowly slide your foot back down until your leg is flat on the floor again.  °• Angel Wings: Lying on your back spread your legs to the side as far apart as you can without causing discomfort.  °• Hamstring Strength:  Lying on your back, push your heel against the floor with your leg straight by tightening up the muscles of your buttocks.  Repeat, but this time bend your knee to a comfortable angle, and push your heel against the floor.  You may put a pillow under the heel to make it more comfortable if necessary.  ° °A rehabilitation program following joint replacement surgery can speed recovery and prevent re-injury in the future due to weakened muscles. Contact your doctor or a physical therapist for more information on knee rehabilitation.  ° ° °CONSTIPATION ° °Constipation is defined medically as fewer than three stools per week and severe constipation as less than one stool per week.  Even if you have a regular bowel pattern at home, your normal regimen is likely to be disrupted due to multiple reasons following surgery.  Combination of anesthesia, postoperative narcotics, change in appetite and fluid intake all can affect your bowels.  ° °YOU MUST use at least one of the following options; they are listed in order of increasing strength to get the job done.  They are all available over the counter, and you may need to use some, POSSIBLY even all of these options:   ° °Drink plenty of fluids (prune juice may be helpful) and high fiber foods °Colace 100 mg by mouth twice a day  °Senokot for constipation as directed and as needed Dulcolax (bisacodyl), take with full glass of water  °Miralax (polyethylene glycol)  once or twice a day as needed. ° °If you have tried all these things and are unable to have a bowel movement in the first 3-4 days after surgery call either your surgeon or your primary doctor.   ° °If you experience loose stools or diarrhea, hold the medications until you stool forms back up.  If your symptoms do not get better within 1 week or if they get worse, check with your doctor.  If you experience "the worst abdominal pain ever" or develop nausea or vomiting, please contact the office immediately for further recommendations for treatment. ° ° °ITCHING:  If you experience itching with your medications, try taking only a single pain pill, or even half a pain pill at a time.  You can also use Benadryl over the counter for itching or also to   help with sleep.   TED HOSE STOCKINGS:  Use stockings on both legs until for at least 2 weeks or as directed by physician office. They may be removed at night for sleeping.  MEDICATIONS:  See your medication summary on the After Visit Summary that nursing will review with you.  You may have some home medications which will be placed on hold until you complete the course of blood thinner medication.  It is important for you to complete the blood thinner medication as prescribed.  PRECAUTIONS:  If you experience chest pain or shortness of breath - call 911 immediately for transfer to the hospital emergency department.   If you develop a fever greater that 101 F, purulent drainage from wound, increased redness or drainage from wound, foul odor from the wound/dressing, or calf pain - CONTACT YOUR SURGEON.                                                   FOLLOW-UP APPOINTMENTS:  If you do not already have a post-op appointment, please call the office for an appointment to be seen by your surgeon.  Guidelines for how soon to be seen are listed in your After Visit Summary, but are typically between 1-4 weeks after surgery.  OTHER INSTRUCTIONS:   Knee  Replacement:  Do not place pillow under knee, focus on keeping the knee straight while resting.   MAKE SURE YOU:   Understand these instructions.   Get help right away if you are not doing well or get worse.    Thank you for letting us be a part of your medical care team.  It is a privilege we respect greatly.  We hope these instructions will help you stay on track for a fast and full recovery!     Information on my medicine - XARELTO (Rivaroxaban)  This medication education was reviewed with me or my healthcare representative as part of my discharge preparation.  The pharmacist that spoke with me during my hospital stay was:  Reece Packerickering, Thomas Robert, [facility]  Why was Xarelto prescribed for you? Xarelto was prescribed for you to reduce the risk of blood clots forming after orthopedic surgery. The medical term for these abnormal blood clots is venous thromboembolism (VTE).  What do you need to know about xarelto ? Take your Xarelto ONCE DAILY at the same time every day. You may take it either with or without food.  If you have difficulty swallowing the tablet whole, you may crush it and mix in applesauce just prior to taking your dose.  Take Xarelto exactly as prescribed by your doctor and DO NOT stop taking Xarelto without talking to the doctor who prescribed the medication.  Stopping without other VTE prevention medication to take the place of Xarelto may increase your risk of developing a clot.  After discharge, you should have regular check-up appointments with your healthcare provider that is prescribing your Xarelto.    What do you do if you miss a dose? If you miss a dose, take it as soon as you remember on the same day then continue your regularly scheduled once daily regimen the next day. Do not take two doses of Xarelto on the same day.   Important Safety Information A possible side effect of Xarelto is bleeding. You should call your healthcare provider right away if  you experience any of the following: ? Bleeding from an injury or your nose that does not stop. ? Unusual colored urine (red or dark brown) or unusual colored stools (red or black). ? Unusual bruising for unknown reasons. ? A serious fall or if you hit your head (even if there is no bleeding).  Some medicines may interact with Xarelto and might increase your risk of bleeding while on Xarelto. To help avoid this, consult your healthcare provider or pharmacist prior to using any new prescription or non-prescription medications, including herbals, vitamins, non-steroidal anti-inflammatory drugs (NSAIDs) and supplements.  This website has more information on Xarelto: [email or web address]

## 2016-07-09 ENCOUNTER — Encounter (HOSPITAL_COMMUNITY): Payer: Self-pay | Admitting: Orthopedic Surgery

## 2016-07-09 LAB — CBC
HEMATOCRIT: 30.6 % — AB (ref 36.0–46.0)
Hemoglobin: 10.1 g/dL — ABNORMAL LOW (ref 12.0–15.0)
MCH: 27.8 pg (ref 26.0–34.0)
MCHC: 33 g/dL (ref 30.0–36.0)
MCV: 84.3 fL (ref 78.0–100.0)
PLATELETS: 346 10*3/uL (ref 150–400)
RBC: 3.63 MIL/uL — ABNORMAL LOW (ref 3.87–5.11)
RDW: 14.1 % (ref 11.5–15.5)
WBC: 15.9 10*3/uL — ABNORMAL HIGH (ref 4.0–10.5)

## 2016-07-09 LAB — BASIC METABOLIC PANEL
Anion gap: 5 (ref 5–15)
BUN: 6 mg/dL (ref 6–20)
CALCIUM: 8.2 mg/dL — AB (ref 8.9–10.3)
CO2: 28 mmol/L (ref 22–32)
Chloride: 98 mmol/L — ABNORMAL LOW (ref 101–111)
Creatinine, Ser: 0.5 mg/dL (ref 0.44–1.00)
GFR calc Af Amer: >60 mL/min (ref >60)
GLUCOSE: 133 mg/dL — AB (ref 65–99)
Potassium: 4 mmol/L (ref 3.5–5.1)
Sodium: 131 mmol/L — ABNORMAL LOW (ref 135–145)

## 2016-07-09 MED ORDER — ALPRAZOLAM 0.5 MG PO TABS
0.5000 mg | ORAL_TABLET | Freq: Three times a day (TID) | ORAL | Status: DC | PRN
  Administered 2016-07-09 – 2016-07-10 (×2): 0.5 mg via ORAL
  Filled 2016-07-09 (×2): qty 1

## 2016-07-09 MED ORDER — METHOCARBAMOL 1000 MG/10ML IJ SOLN
500.0000 mg | Freq: Four times a day (QID) | INTRAVENOUS | Status: DC | PRN
  Administered 2016-07-09 – 2016-07-10 (×4): 500 mg via INTRAVENOUS
  Filled 2016-07-09 (×2): qty 5
  Filled 2016-07-09 (×3): qty 550
  Filled 2016-07-09 (×2): qty 5

## 2016-07-09 MED ORDER — DIPHENHYDRAMINE HCL 25 MG PO CAPS: 25.0000 mg | ORAL_CAPSULE | Freq: Four times a day (QID) | ORAL | Status: DC | PRN

## 2016-07-09 MED ORDER — METOPROLOL TARTRATE 50 MG PO TABS: 50.0000 mg | ORAL_TABLET | Freq: Once | ORAL | Status: DC

## 2016-07-09 MED ORDER — ACETAMINOPHEN 500 MG PO TABS
1000.0000 mg | ORAL_TABLET | Freq: Three times a day (TID) | ORAL | Status: DC
  Administered 2016-07-09 – 2016-07-10 (×5): 1000 mg via ORAL
  Filled 2016-07-09 (×5): qty 2

## 2016-07-09 MED ORDER — HYDROMORPHONE HCL 2 MG/ML IJ SOLN
2.0000 mg | INTRAMUSCULAR | Status: DC | PRN
  Administered 2016-07-09 (×3): 2 mg via INTRAVENOUS
  Filled 2016-07-09 (×3): qty 1

## 2016-07-09 NOTE — Progress Notes (Signed)
Patient ID: Katherine ScarletKarla B Shepherd, female   DOB: 1976/02/11, 40 y.o.   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  More challenged with bilateral proximal hip muscle spasms.  Was on percocet prior to surgery.  No significant events  Objective:   VITALS:   Filed Vitals:   07/09/16 0212 07/09/16 0652  BP: 144/97 106/78  Pulse: 115 112  Temp: 97.9 F (36.6 C) 98 F (36.7 C)  Resp: 18 18    Neurovascular intact Incision: dressing C/D/I  Limited activity thus far which is fine but reviewed simple exercises while in bed  LABS  Recent Labs  07/09/16 0413  HGB 10.1*  HCT 30.6*  WBC 15.9*  PLT 346     Recent Labs  07/09/16 0413  NA 131*  K 4.0  BUN 6  CREATININE 0.50  GLUCOSE 133*    No results for input(s): LABPT, INR in the last 72 hours.   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

## 2016-07-09 NOTE — Progress Notes (Signed)
Patient has had uncontrolled pain for most of the evening and orderd pain medication has not controlled pain. C/o spasm and muscle relaxer given with no results.  Informed on-call physician and new orders received.  Katherine Shepherd  Delford FieldWright 07/09/2016 [phone]

## 2016-07-09 NOTE — Progress Notes (Signed)
Physical Therapy Treatment Note    07/09/16 1500  PT Visit Information  Last PT Received On 07/09/16  Assistance Needed +1  History of Present Illness Pt is a 40 year old female s/p bilateral THA  Subjective Data  Subjective Pt able to ambulate in hallway this afternoon and performed LE exercises in bed.  Precautions  Precautions Fall  Restrictions  Other Position/Activity Restrictions bil WBAT  Pain Assessment  Pain Assessment 0-10  Pain Score 4  Pain Location bil hips  Pain Descriptors / Indicators Aching;Spasm;Sore  Pain Intervention(s) Limited activity within patient's tolerance;Monitored during session;Premedicated before session;Repositioned;Ice applied  Cognition  Arousal/Alertness Awake/alert  Behavior During Therapy WFL for tasks assessed/performed  Overall Cognitive Status Within Functional Limits for tasks assessed  Bed Mobility  Overal bed mobility Needs Assistance  Bed Mobility Supine to Sit;Sit to Supine  Supine to sit Mod assist;HOB elevated  Sit to supine Mod assist  General bed mobility comments pt able to assist a little better with getting to EOB however requiring assist for LEs onto bed  Transfers  Overall transfer level Needs assistance  Equipment used Rolling walker (2 wheeled)  Transfers Sit to/from Stand  Sit to Stand Min assist;From elevated surface  General transfer comment verbal cues for UE and LE positioning, assist to stand and steady  Ambulation/Gait  Ambulation/Gait assistance Min guard  Ambulation Distance (Feet) 80 Feet  Assistive device Rolling walker (2 wheeled)  Gait Pattern/deviations Step-through pattern;Decreased stride length  General Gait Details pt able to tolerate ambulation better this afternoon, short steps, slow pace  Gait velocity decr  Exercises  Exercises Total Joint  Total Joint Exercises  Ankle Circles/Pumps AROM;Both;10 reps  Quad Sets AROM;Both;10 reps  Short Arc Quad AROM;Both;10 reps  Heel Slides AAROM;Both;10  reps  Hip ABduction/ADduction AAROM;Both;10 reps  PT - End of Session  Equipment Utilized During Treatment Gait belt  Activity Tolerance Patient tolerated treatment well  Patient left in bed;with call bell/phone within reach;with bed alarm set;with family/visitor present  PT - Assessment/Plan  PT Plan Current plan remains appropriate  PT Frequency (ACUTE ONLY) 7X/week  Follow Up Recommendations Home health PT  PT equipment None recommended by PT  PT Goal Progression  Progress towards PT goals Progressing toward goals  PT Time Calculation  PT Start Time (ACUTE ONLY) 1435  PT Stop Time (ACUTE ONLY) 1505  PT Time Calculation (min) (ACUTE ONLY) 30 min  PT General Charges  $$ ACUTE PT VISIT 1 Procedure  PT Treatments  $Gait Training 8-22 mins  $Therapeutic Exercise 8-22 mins   Zenovia JarredKati Cauy Melody, PT, DPT 07/09/2016 Pager: [phone]

## 2016-07-09 NOTE — Progress Notes (Signed)
CM met with pt in room to offer choice of home health agency.  Pt chooses [facility] home health agency to render HHPT.  Referral called to [facility] rep, Stanton Kidney.  CM called [facility] DME rep, Melissa to please deliver youth rolling walker and a 3n1 to room prior to discharge.  No other CM needs were communicated.

## 2016-07-09 NOTE — Evaluation (Signed)
Physical Therapy Evaluation Patient Details Name: Katherine Shepherd MRN: [id number] DOB: [birth year] Today's Date: 07/09/2016   History of Present Illness  Pt is a 40 year old female s/p bilateral THA  Clinical Impression  Patient is s/p above surgery resulting in functional limitations due to the deficits listed below (see PT Problem List).  Patient will benefit from skilled PT to increase their independence and safety with mobility to allow discharge to the venue listed below.   Pt reports anxiety and so started mobility slowly today, and pt able to tolerate ambulating in room.  Pt plans to d/c home with assist from spouse.     Follow Up Recommendations Home health PT    Equipment Recommendations  Rolling walker with 5" wheels (may need youth RW if not able to obtain from family)    Recommendations for Other Services       Precautions / Restrictions Precautions Precautions: Fall Restrictions Weight Bearing Restrictions: No Other Position/Activity Restrictions: bil WBAT      Mobility  Bed Mobility Overal bed mobility: Needs Assistance Bed Mobility: Supine to Sit     Supine to sit: Mod assist;HOB elevated     General bed mobility comments: pt reports anxiety so assisted with lower body today, pt did well self assisting with upper body  Transfers Overall transfer level: Needs assistance Equipment used: Rolling walker (2 wheeled) Transfers: Sit to/from Stand Sit to Stand: From elevated surface;Min assist         General transfer comment: verbal cues for UE and LE positioning, assist to stand and steady  Ambulation/Gait Ambulation/Gait assistance: Min assist Ambulation Distance (Feet): 14 Feet Assistive device: Rolling walker (2 wheeled) Gait Pattern/deviations: Step-to pattern;Antalgic;Decreased stride length Gait velocity: decr   General Gait Details: pt reports panic attack initially however able to continue, remained in room for pt comfort, verbal cues for  sequence, RW positioning  Stairs            Wheelchair Mobility    Modified Rankin (Stroke Patients Only)       Balance                                             Pertinent Vitals/Pain Pain Assessment: 0-10 Pain Score: 7  Pain Location: bil hips Pain Descriptors / Indicators: Sore;Aching;Spasm Pain Intervention(s): Limited activity within patient's tolerance;Monitored during session;Repositioned;Premedicated before session    Home Living Family/patient expects to be discharged to:: Private residence Living Arrangements: Spouse/significant other   Type of Home: House Home Access: Stairs to enter Entrance Stairs-Rails: Right Entrance Stairs-Number of Steps: 6 Home Layout: One level Home Equipment: None Additional Comments: may be able to borrow her mother's RW    Prior Function Level of Independence: Independent               Hand Dominance        Extremity/Trunk Assessment   Upper Extremity Assessment: Overall WFL for tasks assessed           Lower Extremity Assessment: RLE deficits/detail;LLE deficits/detail RLE Deficits / Details: functional hip weakness observed LLE Deficits / Details: functional hip weakness observed     Communication   Communication: No difficulties  Cognition Arousal/Alertness: Awake/alert Behavior During Therapy: Anxious Overall Cognitive Status: Within Functional Limits for tasks assessed  General Comments      Exercises        Assessment/Plan    PT Assessment Patient needs continued PT services  PT Diagnosis Difficulty walking;Acute pain   PT Problem List Decreased strength;Decreased activity tolerance;Decreased mobility;Decreased knowledge of use of DME;Pain  PT Treatment Interventions Stair training;Gait training;DME instruction;Patient/family education;Functional mobility training;Therapeutic activities;Therapeutic exercise   PT Goals (Current goals can  be found in the Care Plan section) Acute Rehab PT Goals PT Goal Formulation: With patient Time For Goal Achievement: 07/13/16 Potential to Achieve Goals: Good    Frequency 7X/week   Barriers to discharge        Co-evaluation               End of Session Equipment Utilized During Treatment: Gait belt Activity Tolerance: Patient limited by pain Patient left: in chair;with call bell/phone within reach;with family/visitor present           Time: 6045-4098 PT Time Calculation (min) (ACUTE ONLY): 24 min   Charges:   PT Evaluation $PT Eval Low Complexity: 1 Procedure     PT G Codes:        Katherine Shepherd,Katherine Shepherd 07/09/2016, 12:00 PM Katherine Shepherd, PT, DPT 07/09/2016 Pager: [phone]

## 2016-07-09 NOTE — Progress Notes (Signed)
OT Cancellation Note  Patient Details Name: Katherine Shepherd MRN: [id number] DOB: 05-07-1976   Cancelled Treatment:    Reason Eval/Treat Not Completed: Other (comment). Pt eating lunch.  Will check back later this afternoon or in the morning.    Zacharius Funari 07/09/2016, 1:37 PM  Katherine Shepherd, OTR/L [phone] 07/09/2016

## 2016-07-10 DIAGNOSIS — E876 Hypokalemia: Secondary | ICD-10-CM | POA: Diagnosis not present

## 2016-07-10 DIAGNOSIS — E663 Overweight: Secondary | ICD-10-CM | POA: Diagnosis present

## 2016-07-10 LAB — CBC
HEMATOCRIT: 24.9 % — AB (ref 36.0–46.0)
HEMOGLOBIN: 8.4 g/dL — AB (ref 12.0–15.0)
MCH: 28.3 pg (ref 26.0–34.0)
MCHC: 33.7 g/dL (ref 30.0–36.0)
MCV: 83.8 fL (ref 78.0–100.0)
Platelets: 307 10*3/uL (ref 150–400)
RBC: 2.97 MIL/uL — ABNORMAL LOW (ref 3.87–5.11)
RDW: 14.6 % (ref 11.5–15.5)
WBC: 16.1 10*3/uL — ABNORMAL HIGH (ref 4.0–10.5)

## 2016-07-10 LAB — BASIC METABOLIC PANEL
Anion gap: 6 (ref 5–15)
BUN: 5 mg/dL — AB (ref 6–20)
CALCIUM: 7.8 mg/dL — AB (ref 8.9–10.3)
CHLORIDE: 98 mmol/L — AB (ref 101–111)
CO2: 28 mmol/L (ref 22–32)
CREATININE: 0.63 mg/dL (ref 0.44–1.00)
GFR calc non Af Amer: >60 mL/min (ref >60)
Glucose, Bld: 125 mg/dL — ABNORMAL HIGH (ref 65–99)
Potassium: 3.3 mmol/L — ABNORMAL LOW (ref 3.5–5.1)
SODIUM: 132 mmol/L — AB (ref 135–145)

## 2016-07-10 MED ORDER — POTASSIUM CHLORIDE CRYS ER 20 MEQ PO TBCR
40.0000 meq | EXTENDED_RELEASE_TABLET | Freq: Two times a day (BID) | ORAL | Status: DC
  Administered 2016-07-10: 40 meq via ORAL
  Filled 2016-07-10: qty 2

## 2016-07-10 MED ORDER — RIVAROXABAN 10 MG PO TABS: 10.0000 mg | ORAL_TABLET | ORAL | Status: AC

## 2016-07-10 MED ORDER — DOCUSATE SODIUM 100 MG PO CAPS: 100.0000 mg | ORAL_CAPSULE | Freq: Two times a day (BID) | ORAL | Status: AC

## 2016-07-10 MED ORDER — OXYCODONE HCL 10 MG PO TABS: 10.0000 mg | ORAL_TABLET | ORAL | Status: AC | PRN

## 2016-07-10 MED ORDER — ACETAMINOPHEN 500 MG PO TABS: 1000.0000 mg | ORAL_TABLET | Freq: Three times a day (TID) | ORAL | Status: AC

## 2016-07-10 MED ORDER — FERROUS SULFATE 325 (65 FE) MG PO TABS: 325.0000 mg | ORAL_TABLET | Freq: Three times a day (TID) | ORAL | Status: AC

## 2016-07-10 MED ORDER — POTASSIUM CHLORIDE CRYS ER 20 MEQ PO TBCR: 40.0000 meq | EXTENDED_RELEASE_TABLET | Freq: Every day | ORAL | Status: AC

## 2016-07-10 MED ORDER — CYCLOBENZAPRINE HCL 10 MG PO TABS: 10.0000 mg | ORAL_TABLET | Freq: Three times a day (TID) | ORAL | Status: AC | PRN

## 2016-07-10 MED ORDER — POLYETHYLENE GLYCOL 3350 17 G PO PACK: 17.0000 g | PACK | Freq: Two times a day (BID) | ORAL | Status: AC

## 2016-07-10 MED ORDER — POTASSIUM CHLORIDE CRYS ER 20 MEQ PO TBCR: 40.0000 meq | EXTENDED_RELEASE_TABLET | Freq: Every day | ORAL | Status: DC

## 2016-07-10 MED ORDER — METHOCARBAMOL 500 MG PO TABS
500.0000 mg | ORAL_TABLET | Freq: Four times a day (QID) | ORAL | Status: DC | PRN
  Administered 2016-07-10 (×2): 500 mg via ORAL
  Filled 2016-07-10 (×2): qty 1

## 2016-07-10 NOTE — Progress Notes (Signed)
Physical Therapy Treatment Patient Details Name: Katherine Shepherd MRN: [id number] DOB: [birth year] Today's Date: 07/10/2016    History of Present Illness Pt is a 40 year old female s/p bilateral THA    PT Comments    Pt up in room with spouse upon arrival and departure.  Pt ambulated in hallway and educated on safe stair technique.  Pt would like to practice steps again this afternoon and review exercises prior to d/c.  Follow Up Recommendations  Home health PT     Equipment Recommendations  None recommended by PT    Recommendations for Other Services       Precautions / Restrictions Precautions Precautions: Fall Restrictions Other Position/Activity Restrictions: bil WBAT    Mobility  Bed Mobility         Supine to sit: Mod assist;HOB elevated     General bed mobility comments: pt up in bathroom with spouse on arrival  Transfers   Equipment used: Rolling walker (2 wheeled) Transfers: Sit to/from Stand Sit to Stand: Min assist;From elevated surface         General transfer comment: pt with spouse and left with spouse during session (did not observe transfers)  Ambulation/Gait Ambulation/Gait assistance: Supervision Ambulation Distance (Feet): 60 Feet Assistive device: Rolling walker (2 wheeled) Gait Pattern/deviations: Step-through pattern;Decreased stride length;Wide base of support Gait velocity: decr   General Gait Details: short steps, mild toe in bilaterally, compensates with hip hiking due to decreased hip/knee flexion   Stairs Stairs: Yes Stairs assistance: Min guard Stair Management: Step to pattern;Backwards;With walker Number of Stairs: 2 General stair comments: pt very anxious with steps, increased time required, step by step verbal cues for technique and safety, spouse present  Wheelchair Mobility    Modified Rankin (Stroke Patients Only)       Balance                                    Cognition Arousal/Alertness:  Awake/alert Behavior During Therapy: WFL for tasks assessed/performed Overall Cognitive Status: Within Functional Limits for tasks assessed                      Exercises      General Comments        Pertinent Vitals/Pain Pain Assessment: 0-10 Pain Score: 3  Pain Location: bil hips Pain Descriptors / Indicators: Aching;Sore;Tightness Pain Intervention(s): Limited activity within patient's tolerance;Monitored during session    Home Living Family/patient expects to be discharged to:: Private residence Living Arrangements: Spouse/significant other Available Help at Discharge: Family;Available 24 hours/day         Home Equipment: None Additional Comments: has a window sill next to toilet    Prior Function Level of Independence: Independent          PT Goals (current goals can now be found in the care plan section) Acute Rehab PT Goals Patient Stated Goal: decreased pain Progress towards PT goals: Progressing toward goals    Frequency  7X/week    PT Plan Current plan remains appropriate    Co-evaluation             End of Session Equipment Utilized During Treatment: Gait belt Activity Tolerance: Patient tolerated treatment well Patient left: with call bell/phone within reach;with family/visitor present (heading toward bed in room with spouse)     Time: 4098-11911029-1045 PT Time Calculation (min) (ACUTE ONLY): 16 min  Charges:  $Gait  Training: 8-22 mins                    G Codes:      Katherine Shepherd,Katherine Shepherd 2016/08/09, 12:50 PM Zenovia Jarred, PT, DPT Aug 09, 2016 Pager: [phone]

## 2016-07-10 NOTE — Progress Notes (Signed)
Physical Therapy Treatment Note    07/10/16 1500  PT Visit Information  Last PT Received On 07/10/16  Assistance Needed +1  History of Present Illness Pt is a 40 year old female s/p bilateral THA  Subjective Data  Subjective Pt ambulated to bathroom and then practiced stairs again.  Pt also performed LE exercises in bed.  Pt feels ready for d/c home today.  Precautions  Precautions Fall  Restrictions  Other Position/Activity Restrictions bil WBAT  Pain Assessment  Pain Assessment 0-10  Pain Score 4  Pain Location bil hips  Pain Descriptors / Indicators Aching;Sore;Tightness  Pain Intervention(s) Limited activity within patient's tolerance;Monitored during session  Cognition  Arousal/Alertness Awake/alert  Behavior During Therapy WFL for tasks assessed/performed  Overall Cognitive Status Within Functional Limits for tasks assessed  Bed Mobility  Overal bed mobility Needs Assistance  Bed Mobility Supine to Sit  Supine to sit Supervision  Transfers  Overall transfer level Needs assistance  Equipment used Rolling walker (2 wheeled)  Transfers Sit to/from Stand  Sit to Stand Supervision  Ambulation/Gait  Ambulation/Gait assistance Supervision  Ambulation Distance (Feet) 20 Feet  Assistive device Rolling walker (2 wheeled)  Gait Pattern/deviations Step-through pattern;Decreased stride length;Wide base of support  General Gait Details short steps, mild toe in bilaterally, compensates with hip hiking due to decreased hip/knee flexion  Gait velocity decr  Stairs Yes  Stair Management With walker;Backwards;Step to pattern  Number of Stairs 2  General stair comments spouse assisted with holding RW, cues for sequence and RW positioning  Exercises  Exercises Total Joint  Total Joint Exercises  Ankle Circles/Pumps AROM;Both;10 reps  Quad Sets AROM;Both;10 reps  Gluteal Sets AROM;Both;10 reps  Short Arc Quad AROM;Both;10 reps  Heel Slides AAROM;Both;10 reps  Hip  ABduction/ADduction AAROM;Both;10 reps  PT - End of Session  Activity Tolerance Patient tolerated treatment well  Patient left (with spouse in room)  PT - Assessment/Plan  PT Plan Current plan remains appropriate  PT Frequency (ACUTE ONLY) 7X/week  Follow Up Recommendations Home health PT  PT equipment None recommended by PT  PT Goal Progression  Progress towards PT goals Progressing toward goals  PT Time Calculation  PT Start Time (ACUTE ONLY) 1346  PT Stop Time (ACUTE ONLY) 1414  PT Time Calculation (min) (ACUTE ONLY) 28 min  PT General Charges  $$ ACUTE PT VISIT 1 Procedure  PT Treatments  $Gait Training 8-22 mins  $Therapeutic Exercise 8-22 mins   Zenovia JarredKati Ewel Lona, PT, DPT 07/10/2016 Pager: [phone]

## 2016-07-10 NOTE — Evaluation (Signed)
Occupational Therapy Evaluation Patient Details Name: Katherine Shepherd MRN: [id number] DOB: 03-Jul-1976 Today's Date: 07/10/2016    History of Present Illness Pt is a 40 year old female s/p bilateral THA   Clinical Impression   Pt was admitted for the above. All education was completed. No further OT is needed at this time    Follow Up Recommendations  No OT follow up;Supervision/Assistance - 24 hour    Equipment Recommendations  None recommended by OT (doesn't want bathroom DME)    Recommendations for Other Services       Precautions / Restrictions Precautions Precautions: Fall Restrictions Weight Bearing Restrictions: No Other Position/Activity Restrictions: bil WBAT      Mobility Bed Mobility         Supine to sit: Mod assist;HOB elevated     General bed mobility comments: assisted with bil UE's. Pt used bedrails  Transfers   Equipment used: Rolling walker (2 wheeled) Transfers: Sit to/from Stand Sit to Stand: Min assist;From elevated surface         General transfer comment: verbal cues for UE and LE positioning, assist to stand and steady    Balance                                            ADL Overall ADL's : Needs assistance/impaired     Grooming: Oral care;Min guard;Standing   Upper Body Bathing: Set up;Sitting   Lower Body Bathing: Maximal assistance;Sit to/from stand   Upper Body Dressing : Set up;Sitting   Lower Body Dressing: Maximal assistance;Sit to/from stand   Toilet Transfer: Minimal assistance;Comfort height toilet;Grab bars;RW   Toileting- Architect and Hygiene: Min guard;Sit to/from stand         General ADL Comments: ambulated to bathroom and put her own clothes on with assistance. Stood for teeth.  Husband will assist as needed.  Pt has a window sill next to her commode; she does not want 3:1 commode. She also has a built in seat. Educated on AE, if pt is interested:  She has reachers at  home     Vision     Perception     Praxis      Pertinent Vitals/Pain Pain Score: 4  Pain Location: bil hips Pain Descriptors / Indicators: Aching Pain Intervention(s): Limited activity within patient's tolerance;Monitored during session;Premedicated before session;Repositioned;Ice applied     Hand Dominance     Extremity/Trunk Assessment Upper Extremity Assessment Upper Extremity Assessment: Overall WFL for tasks assessed           Communication Communication Communication: No difficulties   Cognition Arousal/Alertness: Awake/alert Behavior During Therapy: WFL for tasks assessed/performed Overall Cognitive Status: Within Functional Limits for tasks assessed                     General Comments       Exercises       Shoulder Instructions      Home Living Family/patient expects to be discharged to:: Private residence Living Arrangements: Spouse/significant other Available Help at Discharge: Family;Available 24 hours/day               Bathroom Shower/Tub: Producer, television/film/video: Handicapped height     Home Equipment: None   Additional Comments: has a window sill next to toilet      Prior Functioning/Environment Level of Independence: Independent  OT Diagnosis: Acute pain   OT Problem List:     OT Treatment/Interventions:      OT Goals(Current goals can be found in the care plan section) Acute Rehab OT Goals Patient Stated Goal: decreased pain OT Goal Formulation: All assessment and education complete, DC therapy  OT Frequency:     Barriers to D/C:            Co-evaluation              End of Session    Activity Tolerance: Patient tolerated treatment well Patient left: in chair;with call bell/phone within reach;with family/visitor present   Time: 8413-24400856-0924 OT Time Calculation (min): 28 min Charges:  OT General Charges $OT Visit: 1 Procedure OT Evaluation $OT Eval Low Complexity: 1  Procedure OT Treatments $Self Care/Home Management : 8-22 mins G-Codes:    [name] 07/10/2016, 10:03 AM  Marica OtterMaryellen Kimley Apsey, OTR/L [phone] 07/10/2016

## 2016-07-10 NOTE — Progress Notes (Signed)
     Subjective: 2 Days Post-Op Procedure(s) (LRB): BILATERAL ANTERIOR APPROACH TOTAL HIP ARTHROPLASTY (Bilateral)   Patient reports pain as mild, pain controlled.  Had some anxiety with getting up with PT for the first time yesterday, but much better during the second session. No events throughout the night.  Discussed her labs.  Ready to be discharged home.  Objective:   VITALS:   Filed Vitals:   07/09/16 2134 07/10/16 0442  BP: 101/47 97/61  Pulse: 118 110  Temp: 98.5 F (36.9 C) 98.9 F (37.2 C)  Resp: 16 16   Bilaterally: Dorsiflexion/Plantar flexion intact Incision: dressing C/D/I No cellulitis present Compartment soft  LABS  Recent Labs  07/09/16 0413 07/10/16 0423  HGB 10.1* 8.4*  HCT 30.6* 24.9*  WBC 15.9* 16.1*  PLT 346 307     Recent Labs  07/09/16 0413 07/10/16 0423  NA 131* 132*  K 4.0 3.3*  BUN 6 5*  CREATININE 0.50 0.63  GLUCOSE 133* 125*     Assessment/Plan: 2 Days Post-Op Procedure(s) (LRB): BILATERAL ANTERIOR APPROACH TOTAL HIP ARTHROPLASTY (Bilateral) Up with therapy Discharge home with home health  [facility]. Follow up with OLIN,Zaydan Papesh D in 2 weeks.  Contact information:  [facility] [address] [place] [id number] [phone]    Overweight (BMI 25-29.9) Estimated body mass index is 25.03 kg/(m^2) as calculated from the following:   Height as of this encounter: 4\' 11"  (1.499 m).   Weight as of this encounter: 56.246 kg (124 lb). Patient also counseled that weight may inhibit the healing process Patient counseled that losing weight will help with future health issues  Hypokalemia Treated with oral potassium and will observe       Anastasio AuerbachMatthew S. Jessamyn Watterson   PAC  07/10/2016, 8:19 AM

## 2016-07-15 NOTE — Discharge Summary (Signed)
Physician Discharge Summary  Patient ID: Katherine Shepherd MRN: [id number] DOB/AGE: 01/26/1976 40 y.o.  Admit date: 07/08/2016 Discharge date: 07/10/2016   Procedures:  Procedure(s) (LRB): BILATERAL ANTERIOR APPROACH TOTAL HIP ARTHROPLASTY (Bilateral)  Attending Physician:  Dr. Durene Romans   Admission Diagnoses:   Bilateral hip primary OA / pain  Discharge Diagnoses:  Principal Problem:   S/P bilateral THAs, AA Active Problems:   Overweight (BMI 25.0-29.9)   Hypokalemia  Past Medical History  Diagnosis Date  . Family history of adverse reaction to anesthesia     nauea, mother- had episode of not being able to breathe - mother small airway   . Hypertension   . Diabetes mellitus without complication (HCC)     hx of gestational diabetes   . Anxiety   . GERD (gastroesophageal reflux disease)   . Arthritis     HPI:    Katherine Shepherd, 40 y.o. female, has a history of pain and functional disability in the bilateral hip(s) due to arthritis and patient has failed non-surgical conservative treatments for greater than 12 weeks to include NSAID's and/or analgesics, corticosteriod injections and activity modification. Onset of symptoms was gradual starting >10 years ago with rapidlly worsening course since that time.The patient noted no past surgery on the bilateral hip(s). Patient currently rates pain in the bilateral hips at 10 out of 10 with activity. Patient has night pain, worsening of pain with activity and weight bearing, trendelenberg gait, pain that interfers with activities of daily living and pain with passive range of motion. Patient has evidence of periarticular osteophytes and joint space narrowing by imaging studies. This condition presents safety issues increasing the risk of falls. There is no current active infection. Risks, benefits and expectations were discussed with the patient. Risks including but not limited to the risk of anesthesia, blood clots, nerve damage,  blood vessel damage, failure of the prosthesis, infection and up to and including death. Patient understand the risks, benefits and expectations and wishes to proceed with surgery.   PCP: Pcp Not In System   Discharged Condition: good  Hospital Course:  Patient underwent the above stated procedure on 07/08/2016. Patient tolerated the procedure well and brought to the recovery room in good condition and subsequently to the floor.  POD #1 BP: 106/78 ; Pulse: 112 ; Temp: 98 F (36.7 C) ; Resp: 18 Patient reports pain as moderate. More challenged with bilateral proximal hip muscle spasms. Was on percocet prior to surgery. No significant events. Neurovascular intact and incision: dressing C/D/I. Limited activity thus far which is fine but reviewed simple exercises while in bed.  LABS  Basename    HGB     10.1  HCT     30.6   POD #2  BP: 97/61 ; Pulse: 110 ; Temp: 98.9 F (37.2 C) ; Resp: 16 Patient reports pain as mild, pain controlled. Had some anxiety with getting up with PT for the first time yesterday, but much better during the second session. No events throughout the night. Discussed her labs. Ready to be discharged home. Dorsiflexion/plantar flexion intact, incision: dressing C/D/I, no cellulitis present and compartment soft.   LABS  Basename    HGB     8.4  HCT     24.9    Discharge Exam: General appearance: alert, cooperative and no distress Extremities: Homans sign is negative, no sign of DVT, no edema, redness or tenderness in the calves or thighs and no ulcers, gangrene or trophic changes  Disposition:  Home with follow up in 2 weeks   Follow-up Information    Follow up with Shelda PalLIN,Carl Bleecker D, MD. Schedule an appointment as soon as possible for a visit in 2 weeks.   Specialty:  Orthopedic Surgery   Contact information:   [address] [place] [id number] [phone]       Follow up with [facility]   Specialty:   Home Health Services   Why:  home health physical therapy   Contact information:   [address] [place] [id number] [phone]       Discharge Instructions    Call MD / Call 911    Complete by:  As directed   If you experience chest pain or shortness of breath, CALL 911 and be transported to the hospital emergency room.  If you develope a fever above 101 F, pus (white drainage) or increased drainage or redness at the wound, or calf pain, call your surgeon's office.     Change dressing    Complete by:  As directed   Maintain surgical dressing until follow up in the clinic. If the edges start to pull up, may reinforce with tape. If the dressing is no longer working, may remove and cover with gauze and tape, but must keep the area dry and clean.  Call with any questions or concerns.     Constipation Prevention    Complete by:  As directed   Drink plenty of fluids.  Prune juice may be helpful.  You may use a stool softener, such as Colace (over the counter) 100 mg twice a day.  Use MiraLax (over the counter) for constipation as needed.     Diet - low sodium heart healthy    Complete by:  As directed      Discharge instructions    Complete by:  As directed   Maintain surgical dressing until follow up in the clinic. If the edges start to pull up, may reinforce with tape. If the dressing is no longer working, may remove and cover with gauze and tape, but must keep the area dry and clean.  [facility]. Call with any questions or concerns.     Increase activity slowly as tolerated    Complete by:  As directed   Weight bearing as tolerated with assist device (walker, cane, etc) as directed, use it as long as suggested by your surgeon or therapist, typically at least 4-6 weeks.     TED hose    Complete by:  As directed   Use stockings (TED hose) for 2 weeks on both leg(s).  You may remove them at night for sleeping.             Medication List     STOP taking these medications        Diclofenac-Misoprostol 75-0.2 MG Tbec     diphenhydramine-acetaminophen 25-500 MG Tabs tablet  Commonly known as:  TYLENOL PM     oxyCODONE-acetaminophen 7.5-325 MG tablet  Commonly known as:  PERCOCET     TRI-PREVIFEM PO      TAKE these medications        acetaminophen 500 MG tablet  Commonly known as:  TYLENOL  Take 2 tablets (1,000 mg total) by mouth every 8 (eight) hours.     citalopram 20 MG tablet  Commonly known as:  CELEXA  Take 20 mg by mouth daily.  cyclobenzaprine 10 MG tablet  Commonly known as:  FLEXERIL  Take 1 tablet (10 mg total) by mouth 3 (three) times daily as needed for muscle spasms.     docusate sodium 100 MG capsule  Commonly known as:  COLACE  Take 1 capsule (100 mg total) by mouth 2 (two) times daily.     esomeprazole 40 MG capsule  Commonly known as:  NEXIUM  Take 40 mg by mouth 2 (two) times daily before a meal.     ferrous sulfate 325 (65 FE) MG tablet  Take 1 tablet (325 mg total) by mouth 3 (three) times daily after meals.     hydrochlorothiazide 25 MG tablet  Commonly known as:  HYDRODIURIL  Take 12.5 mg by mouth daily.     Oxycodone HCl 10 MG Tabs  Take 1-2 tablets (10-20 mg total) by mouth every 4 (four) hours as needed for severe pain.     polyethylene glycol packet  Commonly known as:  MIRALAX / GLYCOLAX  Take 17 g by mouth 2 (two) times daily.     potassium chloride SA 20 MEQ tablet  Commonly known as:  K-DUR,KLOR-CON  Take 2 tablets (40 mEq total) by mouth daily.     rivaroxaban 10 MG Tabs tablet  Commonly known as:  XARELTO  Take 1 tablet (10 mg total) by mouth daily.         Signed: Anastasio Auerbach. Dewaun Kinzler   PA-C  07/15/2016, 9:20 PM

## 2016-10-28 IMAGING — DX DG PORTABLE PELVIS
1 series · 1 of 1 positions shown · non-contrast
Comparison: None.

CLINICAL DATA: Status post bilateral total hip arthroplasty.

EXAM:
PORTABLE PELVIS 1-2 VIEWS

[pelvis ap]
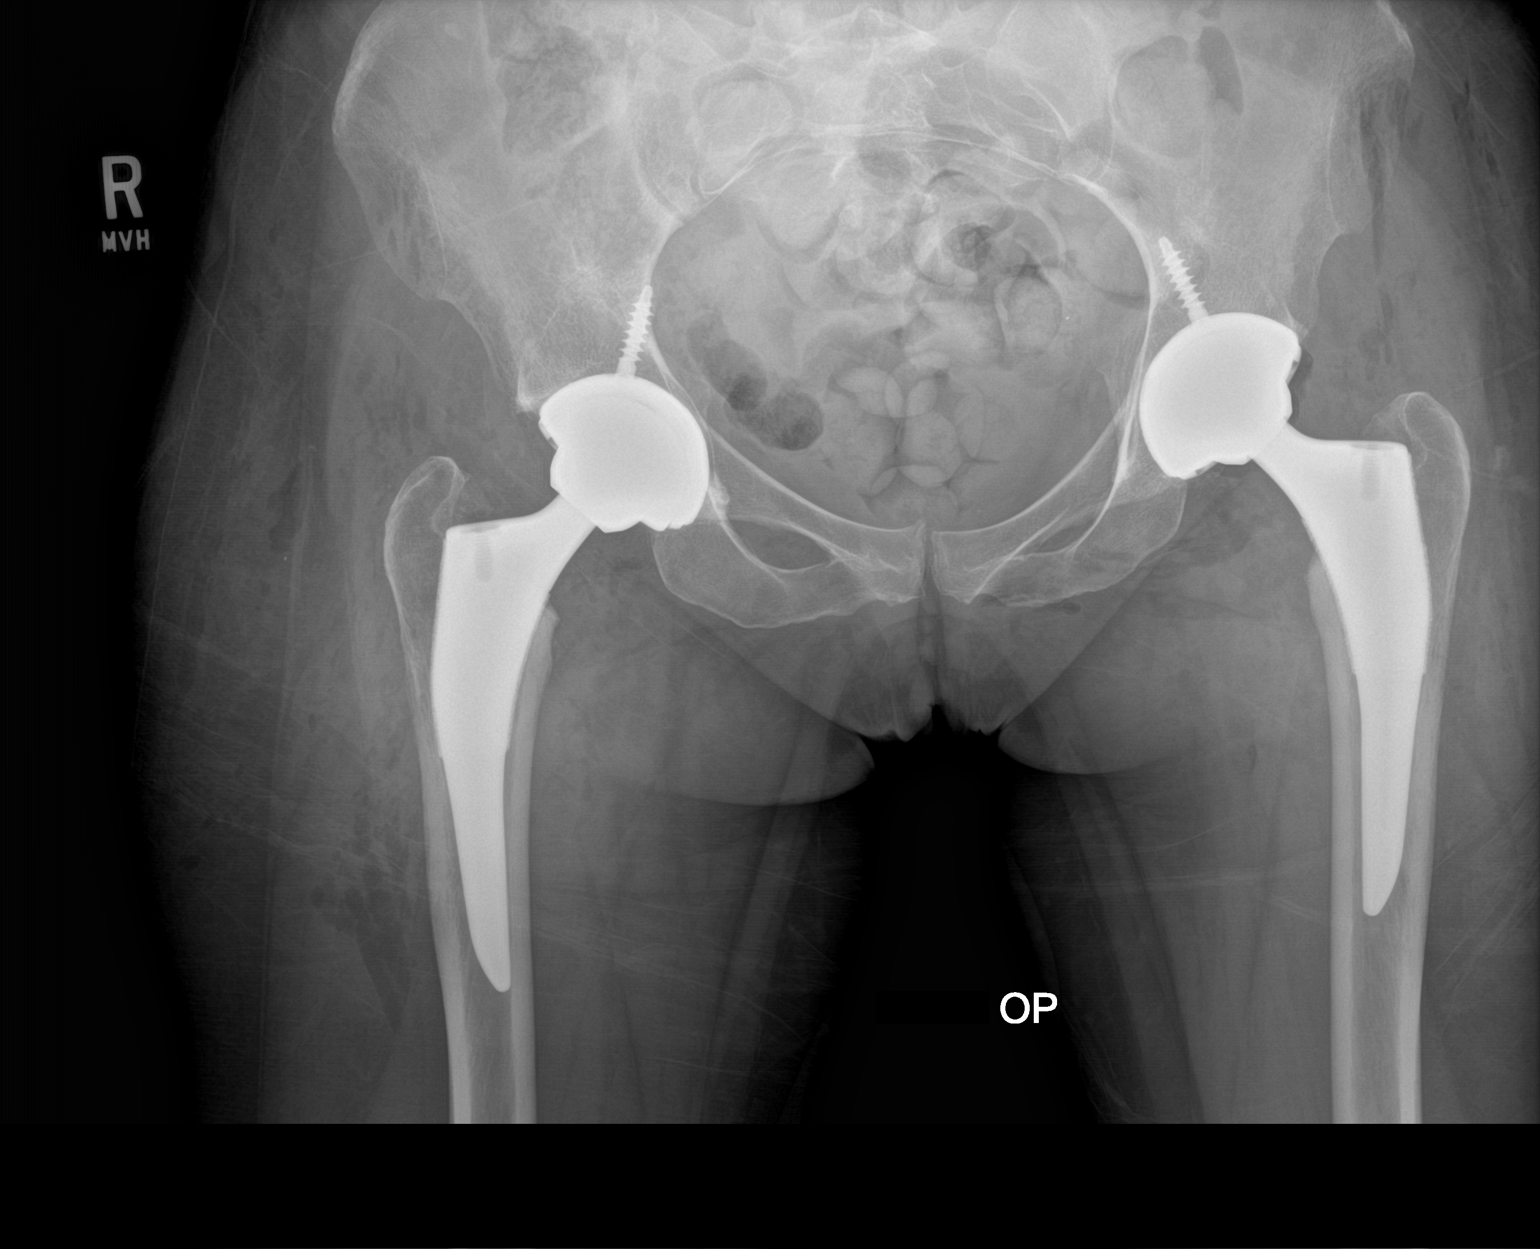

[1 of 1 positions shown; findings below may reference images not displayed]

FINDINGS: Single frontal radiograph of the pelvis demonstrates bilateral total
hip arthroplasty with short stem femoral component and screwed in
acetabular components. The alignment is near anatomic. There is no
evidence of immediate complications. Postprocedural soft tissue
edema and emphysema are noted. No evidence of fracture.
IMPRESSION: Status post bilateral total hip arthroplasty without evidence of
immediate complications.
